# Patient Record
Sex: Female | Born: 1970 | Race: Black or African American | Hispanic: No | Marital: Single | Smoking: Current every day smoker
Health system: Southern US, Community
[De-identification: ages and names within clinical notes are randomized; demographics above are authoritative.]

## PROBLEM LIST (undated history)

## (undated) DIAGNOSIS — K219 Gastro-esophageal reflux disease without esophagitis: Secondary | ICD-10-CM

## (undated) DIAGNOSIS — G8929 Other chronic pain: Secondary | ICD-10-CM

## (undated) DIAGNOSIS — M549 Dorsalgia, unspecified: Secondary | ICD-10-CM

## (undated) DIAGNOSIS — F32A Depression, unspecified: Secondary | ICD-10-CM

## (undated) DIAGNOSIS — O009 Unspecified ectopic pregnancy without intrauterine pregnancy: Secondary | ICD-10-CM

## (undated) DIAGNOSIS — M51369 Other intervertebral disc degeneration, lumbar region without mention of lumbar back pain or lower extremity pain: Secondary | ICD-10-CM

## (undated) DIAGNOSIS — F319 Bipolar disorder, unspecified: Secondary | ICD-10-CM

## (undated) DIAGNOSIS — Z8669 Personal history of other diseases of the nervous system and sense organs: Secondary | ICD-10-CM

## (undated) DIAGNOSIS — F329 Major depressive disorder, single episode, unspecified: Secondary | ICD-10-CM

## (undated) DIAGNOSIS — M5136 Other intervertebral disc degeneration, lumbar region: Secondary | ICD-10-CM

## (undated) DIAGNOSIS — I2089 Other forms of angina pectoris: Secondary | ICD-10-CM

## (undated) DIAGNOSIS — G935 Compression of brain: Secondary | ICD-10-CM

## (undated) DIAGNOSIS — M5030 Other cervical disc degeneration, unspecified cervical region: Secondary | ICD-10-CM

## (undated) DIAGNOSIS — G039 Meningitis, unspecified: Secondary | ICD-10-CM

## (undated) DIAGNOSIS — K5792 Diverticulitis of intestine, part unspecified, without perforation or abscess without bleeding: Secondary | ICD-10-CM

## (undated) DIAGNOSIS — I208 Other forms of angina pectoris: Secondary | ICD-10-CM

## (undated) HISTORY — DX: Diverticulitis of intestine, part unspecified, without perforation or abscess without bleeding: K57.92

## (undated) HISTORY — PX: CHOLECYSTECTOMY: SHX55

## (undated) HISTORY — DX: Compression of brain: G93.5

## (undated) HISTORY — PX: VENTRICULOPERITONEAL SHUNT: SHX204

## (undated) HISTORY — DX: Other forms of angina pectoris: I20.89

## (undated) HISTORY — DX: Other intervertebral disc degeneration, lumbar region without mention of lumbar back pain or lower extremity pain: M51.369

## (undated) HISTORY — DX: Other forms of angina pectoris: I20.8

## (undated) HISTORY — DX: Meningitis, unspecified: G03.9

## (undated) HISTORY — DX: Gastro-esophageal reflux disease without esophagitis: K21.9

## (undated) HISTORY — DX: Other cervical disc degeneration, unspecified cervical region: M50.30

## (undated) HISTORY — DX: Other intervertebral disc degeneration, lumbar region: M51.36

## (undated) HISTORY — DX: Personal history of other diseases of the nervous system and sense organs: Z86.69

## (undated) HISTORY — PX: OTHER SURGICAL HISTORY: SHX169

## (undated) HISTORY — PX: ABDOMINAL HYSTERECTOMY: SHX81

## (undated) HISTORY — PX: BACK SURGERY: SHX140

## (undated) HISTORY — PX: BRAIN SURGERY: SHX531

---

## 1997-12-26 ENCOUNTER — Emergency Department (HOSPITAL_COMMUNITY): Admission: EM | Admit: 1997-12-26 | Discharge: 1997-12-26 | Payer: Self-pay | Admitting: Internal Medicine

## 1997-12-29 ENCOUNTER — Inpatient Hospital Stay (HOSPITAL_COMMUNITY): Admission: AD | Admit: 1997-12-29 | Discharge: 1997-12-31 | Payer: Self-pay | Admitting: Obstetrics & Gynecology

## 1998-01-14 ENCOUNTER — Inpatient Hospital Stay (HOSPITAL_COMMUNITY): Admission: AD | Admit: 1998-01-14 | Discharge: 1998-01-17 | Payer: Self-pay | Admitting: *Deleted

## 1998-03-01 ENCOUNTER — Emergency Department (HOSPITAL_COMMUNITY): Admission: EM | Admit: 1998-03-01 | Discharge: 1998-03-01 | Payer: Self-pay | Admitting: Emergency Medicine

## 1999-11-11 ENCOUNTER — Inpatient Hospital Stay (HOSPITAL_COMMUNITY): Admission: AD | Admit: 1999-11-11 | Discharge: 1999-11-11 | Payer: Self-pay | Admitting: Obstetrics

## 2000-01-16 ENCOUNTER — Inpatient Hospital Stay (HOSPITAL_COMMUNITY): Admission: EM | Admit: 2000-01-16 | Discharge: 2000-01-16 | Payer: Self-pay | Admitting: Obstetrics

## 2000-01-18 ENCOUNTER — Inpatient Hospital Stay (HOSPITAL_COMMUNITY): Admission: AD | Admit: 2000-01-18 | Discharge: 2000-01-18 | Payer: Self-pay | Admitting: *Deleted

## 2000-04-17 ENCOUNTER — Inpatient Hospital Stay (HOSPITAL_COMMUNITY): Admission: AD | Admit: 2000-04-17 | Discharge: 2000-04-17 | Payer: Self-pay | Admitting: Obstetrics

## 2000-10-20 ENCOUNTER — Emergency Department (HOSPITAL_COMMUNITY): Admission: EM | Admit: 2000-10-20 | Discharge: 2000-10-20 | Payer: Self-pay

## 2001-01-07 ENCOUNTER — Emergency Department (HOSPITAL_COMMUNITY): Admission: EM | Admit: 2001-01-07 | Discharge: 2001-01-07 | Payer: Self-pay | Admitting: *Deleted

## 2001-05-23 ENCOUNTER — Inpatient Hospital Stay (HOSPITAL_COMMUNITY): Admission: AD | Admit: 2001-05-23 | Discharge: 2001-05-23 | Payer: Self-pay | Admitting: Obstetrics & Gynecology

## 2001-05-28 ENCOUNTER — Encounter: Payer: Self-pay | Admitting: Family Medicine

## 2001-05-28 ENCOUNTER — Encounter: Admission: RE | Admit: 2001-05-28 | Discharge: 2001-05-28 | Payer: Self-pay | Admitting: Family Medicine

## 2001-06-27 ENCOUNTER — Encounter: Payer: Self-pay | Admitting: *Deleted

## 2001-06-27 ENCOUNTER — Observation Stay (HOSPITAL_COMMUNITY): Admission: AD | Admit: 2001-06-27 | Discharge: 2001-06-28 | Payer: Self-pay | Admitting: *Deleted

## 2001-06-27 ENCOUNTER — Encounter (INDEPENDENT_AMBULATORY_CARE_PROVIDER_SITE_OTHER): Payer: Self-pay

## 2001-06-30 ENCOUNTER — Inpatient Hospital Stay (HOSPITAL_COMMUNITY): Admission: AD | Admit: 2001-06-30 | Discharge: 2001-06-30 | Payer: Self-pay | Admitting: *Deleted

## 2001-07-01 ENCOUNTER — Inpatient Hospital Stay (HOSPITAL_COMMUNITY): Admission: AD | Admit: 2001-07-01 | Discharge: 2001-07-01 | Payer: Self-pay | Admitting: Obstetrics

## 2001-09-15 ENCOUNTER — Emergency Department (HOSPITAL_COMMUNITY): Admission: EM | Admit: 2001-09-15 | Discharge: 2001-09-15 | Payer: Self-pay | Admitting: Internal Medicine

## 2001-09-15 ENCOUNTER — Encounter: Payer: Self-pay | Admitting: Emergency Medicine

## 2001-09-18 ENCOUNTER — Encounter: Payer: Self-pay | Admitting: Emergency Medicine

## 2001-09-18 ENCOUNTER — Ambulatory Visit (HOSPITAL_COMMUNITY): Admission: RE | Admit: 2001-09-18 | Discharge: 2001-09-18 | Payer: Self-pay | Admitting: Emergency Medicine

## 2001-09-28 ENCOUNTER — Encounter: Admission: RE | Admit: 2001-09-28 | Discharge: 2001-12-27 | Payer: Self-pay | Admitting: Orthopedic Surgery

## 2001-11-03 ENCOUNTER — Encounter: Payer: Self-pay | Admitting: Family Medicine

## 2001-11-03 ENCOUNTER — Ambulatory Visit (HOSPITAL_COMMUNITY): Admission: RE | Admit: 2001-11-03 | Discharge: 2001-11-03 | Payer: Self-pay | Admitting: Family Medicine

## 2001-12-28 ENCOUNTER — Encounter: Payer: Self-pay | Admitting: Neurosurgery

## 2001-12-28 ENCOUNTER — Encounter: Admission: RE | Admit: 2001-12-28 | Discharge: 2001-12-28 | Payer: Self-pay | Admitting: Neurosurgery

## 2002-01-13 ENCOUNTER — Ambulatory Visit (HOSPITAL_COMMUNITY): Admission: RE | Admit: 2002-01-13 | Discharge: 2002-01-14 | Payer: Self-pay | Admitting: Neurosurgery

## 2002-01-13 ENCOUNTER — Encounter: Payer: Self-pay | Admitting: Neurosurgery

## 2002-02-26 ENCOUNTER — Encounter: Payer: Self-pay | Admitting: Neurosurgery

## 2002-02-26 ENCOUNTER — Ambulatory Visit (HOSPITAL_COMMUNITY): Admission: RE | Admit: 2002-02-26 | Discharge: 2002-02-26 | Payer: Self-pay | Admitting: Neurosurgery

## 2002-03-19 ENCOUNTER — Encounter: Payer: Self-pay | Admitting: Emergency Medicine

## 2002-03-19 ENCOUNTER — Emergency Department (HOSPITAL_COMMUNITY): Admission: EM | Admit: 2002-03-19 | Discharge: 2002-03-19 | Payer: Self-pay | Admitting: Emergency Medicine

## 2002-04-14 ENCOUNTER — Inpatient Hospital Stay (HOSPITAL_COMMUNITY): Admission: RE | Admit: 2002-04-14 | Discharge: 2002-04-19 | Payer: Self-pay | Admitting: Neurosurgery

## 2002-05-08 ENCOUNTER — Encounter: Payer: Self-pay | Admitting: Emergency Medicine

## 2002-05-08 ENCOUNTER — Emergency Department (HOSPITAL_COMMUNITY): Admission: EM | Admit: 2002-05-08 | Discharge: 2002-05-08 | Payer: Self-pay | Admitting: Emergency Medicine

## 2002-05-08 ENCOUNTER — Encounter: Payer: Self-pay | Admitting: Neurosurgery

## 2002-05-11 ENCOUNTER — Inpatient Hospital Stay (HOSPITAL_COMMUNITY): Admission: AD | Admit: 2002-05-11 | Discharge: 2002-05-21 | Payer: Self-pay | Admitting: Neurosurgery

## 2002-05-19 ENCOUNTER — Encounter: Payer: Self-pay | Admitting: Neurosurgery

## 2002-05-20 ENCOUNTER — Encounter: Payer: Self-pay | Admitting: Neurosurgery

## 2002-06-02 ENCOUNTER — Emergency Department (HOSPITAL_COMMUNITY): Admission: EM | Admit: 2002-06-02 | Discharge: 2002-06-02 | Payer: Self-pay | Admitting: Emergency Medicine

## 2002-06-02 ENCOUNTER — Encounter: Payer: Self-pay | Admitting: Emergency Medicine

## 2002-06-05 ENCOUNTER — Inpatient Hospital Stay (HOSPITAL_COMMUNITY): Admission: EM | Admit: 2002-06-05 | Discharge: 2002-06-07 | Payer: Self-pay | Admitting: Emergency Medicine

## 2002-06-05 ENCOUNTER — Encounter: Payer: Self-pay | Admitting: Emergency Medicine

## 2002-06-06 ENCOUNTER — Encounter: Payer: Self-pay | Admitting: Surgery

## 2002-06-07 ENCOUNTER — Encounter (INDEPENDENT_AMBULATORY_CARE_PROVIDER_SITE_OTHER): Payer: Self-pay

## 2002-06-23 ENCOUNTER — Ambulatory Visit (HOSPITAL_COMMUNITY): Admission: RE | Admit: 2002-06-23 | Discharge: 2002-06-23 | Payer: Self-pay | Admitting: Neurosurgery

## 2002-06-23 ENCOUNTER — Encounter: Payer: Self-pay | Admitting: Neurosurgery

## 2002-07-12 ENCOUNTER — Inpatient Hospital Stay (HOSPITAL_COMMUNITY): Admission: RE | Admit: 2002-07-12 | Discharge: 2002-07-14 | Payer: Self-pay | Admitting: Neurosurgery

## 2002-07-16 ENCOUNTER — Encounter: Payer: Self-pay | Admitting: Emergency Medicine

## 2002-07-16 ENCOUNTER — Emergency Department (HOSPITAL_COMMUNITY): Admission: EM | Admit: 2002-07-16 | Discharge: 2002-07-17 | Payer: Self-pay | Admitting: Emergency Medicine

## 2002-08-17 ENCOUNTER — Encounter: Admission: RE | Admit: 2002-08-17 | Discharge: 2002-08-17 | Payer: Self-pay | Admitting: Neurosurgery

## 2002-08-17 ENCOUNTER — Encounter: Payer: Self-pay | Admitting: Neurosurgery

## 2002-09-08 ENCOUNTER — Inpatient Hospital Stay (HOSPITAL_COMMUNITY): Admission: RE | Admit: 2002-09-08 | Discharge: 2002-09-10 | Payer: Self-pay | Admitting: Neurosurgery

## 2002-10-14 ENCOUNTER — Encounter: Admission: RE | Admit: 2002-10-14 | Discharge: 2003-01-12 | Payer: Self-pay | Admitting: Neurosurgery

## 2002-10-26 ENCOUNTER — Emergency Department (HOSPITAL_COMMUNITY): Admission: EM | Admit: 2002-10-26 | Discharge: 2002-10-27 | Payer: Self-pay | Admitting: Emergency Medicine

## 2002-10-27 ENCOUNTER — Encounter: Payer: Self-pay | Admitting: Emergency Medicine

## 2002-12-09 ENCOUNTER — Ambulatory Visit (HOSPITAL_COMMUNITY): Admission: RE | Admit: 2002-12-09 | Discharge: 2002-12-09 | Payer: Self-pay | Admitting: Neurosurgery

## 2003-02-01 ENCOUNTER — Encounter: Payer: Self-pay | Admitting: Neurosurgery

## 2003-02-01 ENCOUNTER — Ambulatory Visit (HOSPITAL_COMMUNITY): Admission: RE | Admit: 2003-02-01 | Discharge: 2003-02-01 | Payer: Self-pay | Admitting: Neurosurgery

## 2003-02-10 ENCOUNTER — Encounter: Admission: RE | Admit: 2003-02-10 | Discharge: 2003-04-22 | Payer: Self-pay | Admitting: Neurosurgery

## 2003-02-16 ENCOUNTER — Inpatient Hospital Stay (HOSPITAL_COMMUNITY): Admission: AD | Admit: 2003-02-16 | Discharge: 2003-02-16 | Payer: Self-pay | Admitting: Obstetrics and Gynecology

## 2003-05-18 ENCOUNTER — Ambulatory Visit (HOSPITAL_COMMUNITY): Admission: RE | Admit: 2003-05-18 | Discharge: 2003-05-18 | Payer: Self-pay | Admitting: Family Medicine

## 2003-07-13 ENCOUNTER — Emergency Department (HOSPITAL_COMMUNITY): Admission: EM | Admit: 2003-07-13 | Discharge: 2003-07-13 | Payer: Self-pay | Admitting: Emergency Medicine

## 2003-09-07 ENCOUNTER — Encounter
Admission: RE | Admit: 2003-09-07 | Discharge: 2003-12-06 | Payer: Self-pay | Admitting: Physical Medicine & Rehabilitation

## 2003-11-17 ENCOUNTER — Encounter: Admission: RE | Admit: 2003-11-17 | Discharge: 2003-11-17 | Payer: Self-pay | Admitting: Obstetrics and Gynecology

## 2003-12-02 ENCOUNTER — Emergency Department (HOSPITAL_COMMUNITY): Admission: EM | Admit: 2003-12-02 | Discharge: 2003-12-02 | Payer: Self-pay | Admitting: Emergency Medicine

## 2003-12-20 ENCOUNTER — Encounter
Admission: RE | Admit: 2003-12-20 | Discharge: 2004-02-16 | Payer: Self-pay | Admitting: Physical Medicine & Rehabilitation

## 2004-02-16 ENCOUNTER — Encounter
Admission: RE | Admit: 2004-02-16 | Discharge: 2004-05-16 | Payer: Self-pay | Admitting: Physical Medicine & Rehabilitation

## 2004-02-20 ENCOUNTER — Ambulatory Visit: Payer: Self-pay | Admitting: Physical Medicine & Rehabilitation

## 2004-05-02 ENCOUNTER — Ambulatory Visit: Payer: Self-pay | Admitting: Family Medicine

## 2004-05-10 ENCOUNTER — Emergency Department (HOSPITAL_COMMUNITY): Admission: EM | Admit: 2004-05-10 | Discharge: 2004-05-10 | Payer: Self-pay | Admitting: Emergency Medicine

## 2004-05-17 ENCOUNTER — Encounter
Admission: RE | Admit: 2004-05-17 | Discharge: 2004-08-13 | Payer: Self-pay | Admitting: Physical Medicine & Rehabilitation

## 2004-07-04 ENCOUNTER — Ambulatory Visit: Payer: Self-pay | Admitting: Family Medicine

## 2004-08-13 ENCOUNTER — Encounter
Admission: RE | Admit: 2004-08-13 | Discharge: 2004-11-09 | Payer: Self-pay | Admitting: Physical Medicine & Rehabilitation

## 2004-08-15 ENCOUNTER — Ambulatory Visit: Payer: Self-pay | Admitting: Physical Medicine & Rehabilitation

## 2004-09-06 ENCOUNTER — Ambulatory Visit: Payer: Self-pay | Admitting: Family Medicine

## 2004-11-09 ENCOUNTER — Encounter
Admission: RE | Admit: 2004-11-09 | Discharge: 2005-02-07 | Payer: Self-pay | Admitting: Physical Medicine & Rehabilitation

## 2004-11-12 ENCOUNTER — Ambulatory Visit: Payer: Self-pay | Admitting: Physical Medicine & Rehabilitation

## 2005-01-22 ENCOUNTER — Ambulatory Visit: Payer: Self-pay | Admitting: Physical Medicine & Rehabilitation

## 2005-03-20 ENCOUNTER — Encounter
Admission: RE | Admit: 2005-03-20 | Discharge: 2005-06-18 | Payer: Self-pay | Admitting: Physical Medicine & Rehabilitation

## 2005-03-20 ENCOUNTER — Ambulatory Visit: Payer: Self-pay | Admitting: Physical Medicine & Rehabilitation

## 2005-07-02 ENCOUNTER — Ambulatory Visit: Payer: Self-pay | Admitting: Physical Medicine & Rehabilitation

## 2005-07-02 ENCOUNTER — Encounter
Admission: RE | Admit: 2005-07-02 | Discharge: 2005-09-30 | Payer: Self-pay | Admitting: Physical Medicine & Rehabilitation

## 2005-09-27 ENCOUNTER — Ambulatory Visit: Payer: Self-pay | Admitting: Physical Medicine & Rehabilitation

## 2005-09-27 ENCOUNTER — Encounter
Admission: RE | Admit: 2005-09-27 | Discharge: 2005-12-26 | Payer: Self-pay | Admitting: Physical Medicine & Rehabilitation

## 2005-12-27 ENCOUNTER — Ambulatory Visit: Payer: Self-pay | Admitting: Physical Medicine & Rehabilitation

## 2005-12-27 ENCOUNTER — Encounter
Admission: RE | Admit: 2005-12-27 | Discharge: 2006-03-27 | Payer: Self-pay | Admitting: Physical Medicine & Rehabilitation

## 2006-03-26 ENCOUNTER — Ambulatory Visit: Payer: Self-pay | Admitting: Physical Medicine & Rehabilitation

## 2006-03-26 ENCOUNTER — Encounter
Admission: RE | Admit: 2006-03-26 | Discharge: 2006-06-24 | Payer: Self-pay | Admitting: Physical Medicine & Rehabilitation

## 2006-07-23 ENCOUNTER — Encounter
Admission: RE | Admit: 2006-07-23 | Discharge: 2006-10-21 | Payer: Self-pay | Admitting: Physical Medicine & Rehabilitation

## 2006-07-23 ENCOUNTER — Ambulatory Visit: Payer: Self-pay | Admitting: Physical Medicine & Rehabilitation

## 2006-11-06 ENCOUNTER — Encounter
Admission: RE | Admit: 2006-11-06 | Discharge: 2007-02-04 | Payer: Self-pay | Admitting: Physical Medicine & Rehabilitation

## 2006-11-06 ENCOUNTER — Ambulatory Visit: Payer: Self-pay | Admitting: Physical Medicine & Rehabilitation

## 2007-01-30 ENCOUNTER — Ambulatory Visit: Payer: Self-pay | Admitting: Physical Medicine & Rehabilitation

## 2007-04-24 ENCOUNTER — Ambulatory Visit: Payer: Self-pay | Admitting: Physical Medicine & Rehabilitation

## 2007-04-24 ENCOUNTER — Encounter
Admission: RE | Admit: 2007-04-24 | Discharge: 2007-04-27 | Payer: Self-pay | Admitting: Physical Medicine & Rehabilitation

## 2007-07-21 ENCOUNTER — Ambulatory Visit: Payer: Self-pay | Admitting: Physical Medicine & Rehabilitation

## 2007-07-22 ENCOUNTER — Encounter
Admission: RE | Admit: 2007-07-22 | Discharge: 2007-07-22 | Payer: Self-pay | Admitting: Physical Medicine & Rehabilitation

## 2007-10-15 ENCOUNTER — Encounter
Admission: RE | Admit: 2007-10-15 | Discharge: 2007-10-19 | Payer: Self-pay | Admitting: Physical Medicine & Rehabilitation

## 2007-10-19 ENCOUNTER — Ambulatory Visit: Payer: Self-pay | Admitting: Physical Medicine & Rehabilitation

## 2008-01-11 ENCOUNTER — Encounter
Admission: RE | Admit: 2008-01-11 | Discharge: 2008-01-11 | Payer: Self-pay | Admitting: Physical Medicine & Rehabilitation

## 2008-01-11 ENCOUNTER — Ambulatory Visit: Payer: Self-pay | Admitting: Physical Medicine & Rehabilitation

## 2008-04-11 ENCOUNTER — Ambulatory Visit: Payer: Self-pay | Admitting: Physical Medicine & Rehabilitation

## 2008-04-11 ENCOUNTER — Encounter
Admission: RE | Admit: 2008-04-11 | Discharge: 2008-04-11 | Payer: Self-pay | Admitting: Physical Medicine & Rehabilitation

## 2008-07-08 ENCOUNTER — Encounter
Admission: RE | Admit: 2008-07-08 | Discharge: 2008-10-06 | Payer: Self-pay | Admitting: Physical Medicine & Rehabilitation

## 2008-07-13 ENCOUNTER — Ambulatory Visit: Payer: Self-pay | Admitting: Physical Medicine & Rehabilitation

## 2008-09-07 ENCOUNTER — Ambulatory Visit: Payer: Self-pay | Admitting: Diagnostic Radiology

## 2008-09-07 ENCOUNTER — Emergency Department (HOSPITAL_BASED_OUTPATIENT_CLINIC_OR_DEPARTMENT_OTHER): Admission: EM | Admit: 2008-09-07 | Discharge: 2008-09-07 | Payer: Self-pay | Admitting: Emergency Medicine

## 2009-01-12 ENCOUNTER — Emergency Department (HOSPITAL_BASED_OUTPATIENT_CLINIC_OR_DEPARTMENT_OTHER): Admission: EM | Admit: 2009-01-12 | Discharge: 2009-01-12 | Payer: Self-pay | Admitting: Emergency Medicine

## 2009-01-12 ENCOUNTER — Ambulatory Visit: Payer: Self-pay | Admitting: Radiology

## 2009-02-28 ENCOUNTER — Emergency Department (HOSPITAL_BASED_OUTPATIENT_CLINIC_OR_DEPARTMENT_OTHER): Admission: EM | Admit: 2009-02-28 | Discharge: 2009-02-28 | Payer: Self-pay | Admitting: Emergency Medicine

## 2010-10-23 NOTE — Assessment & Plan Note (Signed)
Ms. Beth Kennedy returns to the clinic today for a follow-up evaluation.  She  continues to have problems with her runaway daughter, who should be on  psychiatric medications.  She reports to me that the 39 year old runs  away and then does not take her antipsychotic medication.   The patient herself reports that she has been using her Flexeril,  hydrocodone, and Neurontin.  She reports that she has started using  aspirin, as many as three per day, as that seems to give her some relief  from her headaches.  I have warned her against over-use of the aspirin  as a potential cause of ulcers.   MEDICATIONS:  1. Flexeril 10 mg t.i.d. p.r.n.  2. Hydrocodone 5/325 1 tablet 5 times per day p.r.n.  3. Neurontin 300 mg 1/2 tablet t.i.d.  4. Depakote daily.  5. Aspirin 325 mg 1 tablet t.i.d.   REVIEW OF SYSTEMS:  Positive for weight loss and poor appetite.   PHYSICAL EXAMINATION:  A middle-aged adult female in moderate acute  discomfort.  Blood pressure 120/57 with a pulse of 82, respiratory rate 18, O2  saturation 98% on room air.  She ambulates without any assistive device.  She has at least +4 to -5/5  strength throughout.   IMPRESSION:  1. History of left-sided arm and leg weakness after prior      ventriculoperitoneal shunt.  2. Recent left fourth metatarsal fracture, treated conservatively.   In the office today, we did discuss the over-use of the aspirin  medication and the fact that she will likely develop stomach ulcers if  she continues to use the amount that she is using.  Instead, I have  asked her to cut down to no more than one tablet per day, then she can  increase her hydrocodone up to a maximum of six tablets per day.  We  also refilled her Neurontin and Flexeril in the office today.  We will  plan on seeing her in followup in approximately 3-4 months' time with  refills prior to that appointment as necessary.           ______________________________  Ellwood Dense,  M.D.     DC/MedQ  D:  10/19/2007 13:55:33  T:  10/19/2007 14:06:29  Job #:  [id number]

## 2010-10-23 NOTE — Assessment & Plan Note (Signed)
Ms. Texidor returns to clinic today for followup evaluation.  She  reports that she is having some increased pain with colder damper  weather recently.  She does need a refill on her hydrocodone and  ibuprofen in the office today.  Those two medications along with her  Flexeril are what she is using for pain, and she gets reasonable relief  overall.   MEDICATIONS:  1. Flexeril 10 mg one tablet t.i.d.  2. Ibuprofen 600 mg 1/2 tablet p.o. b.i.d.  3. Hydrocodone 5/325 one tablet q.i.d. p.r.n. (3-4 per day).   REVIEW OF SYSTEMS:  Noncontributory.   PHYSICAL EXAMINATION:  GENERAL:  A well-appearing middle-aged adult  female in mild acute discomfort.  VITAL SIGNS:  Blood pressure 113/64, pulse 81, respiratory rate 16, O2  saturation 98% on room air.  EXTREMITIES:  She ambulates without any assistive device with slight  weakness of her left leg.   IMPRESSION:  1. History of left-sided arm and leg weakness after prior      ventriculoperitoneal shunt.  2. Recent left fourth metatarsal fracture treated conservatively.  3. In the office today we did refill the patient's ibuprofen along      with her hydrocodone.  Will plan on seeing the patient in followup      in this office in approximately three months' time with refills      prior to that appointment if necessary.  She continues to get good      analgesic effect without significant side effects or signs of      diversion.           ______________________________  Ellwood Dense, M.D.     DC/MedQ  D:  04/27/2007 09:55:36  T:  04/27/2007 13:42:55  Job #:  [id number]

## 2010-10-23 NOTE — Assessment & Plan Note (Signed)
REASON FOR VISIT:  Ms. Loveall returns to clinic today for followup  evaluation.  She reports that she has stopped the chiropractor treatment  completely.  She reports that she was doing well until they started  doing adjustments of her neck and then she had increased pain.  She also  reports that she is out of her psychiatric medicines, but plans to  follow up with [facility] on February 11, 2007.  She is  taking hydrocodone approximately 3-4 times per day and getting a fair  amount of relief along with Flexeril and ibuprofen.  She does need  refills on each of those last three medicines.   MEDICATIONS:  1. Flexeril 10 mg one tablet t.i.d.  2. Ibuprofen 600 mg one-half tablet b.i.d.  3. Hydrocodone 5/325 one tablet three to four times per day p.r.n.   REVIEW OF SYSTEMS:  Positive for fever and chills along with poor  appetite.   PHYSICAL EXAMINATION:  GENERAL:  Well-appearing, middle-aged, adult  female in mild acute discomfort.  VITAL SIGNS:  Blood pressure 117/73, pulse 85, respirations 18, O2  saturations 98% on room air.  NEUROLOGIC:  She has 4/5 strength throughout the bilateral upper and  lower extremities.  She ambulates without any assistive device.   IMPRESSION:  History of left-sided arm and leg weakness after prior  ventriculoperitoneal shunt.  Recent left fourth metatarsal fracture,  treated conservatively.   PLAN:  In the office today, we did refill the patient's Flexeril along  with her ibuprofen and hydrocodone.  Will plan on seeing her in followup  in approximately 3 months' time with refills prior to that appointment  as necessary.           ______________________________  Ellwood Dense, M.D.     DC/MedQ  D:  02/02/2007 10:15:50  T:  02/02/2007 12:41:48  Job #:  [id number]

## 2010-10-23 NOTE — Assessment & Plan Note (Signed)
Beth Kennedy returns to clinic today for follow up evaluation.  She  reports that she fell yesterday on her sisters patio when she tripped  over a nail that was sticking up.  She reportedly hit her head and plans  to have an MRI scan today to evaluate the prior ventriculoperitoneal  shunt.  She reports that she is getting reasonable relief from the  hydrocodone, ibuprofen and Flexeril.  She does note some sleepiness with  the Flexeril, especially when she takes it in the morning.  She does  need refills on each of her medicines in the office today.   MEDICATIONS:  1. Trazodone 100 mg p.o. at bedtime.  2. Flexeril 10 mg twice daily.  3. Ibuprofen 600 mg 1/2 tablet twice daily.  4. Hydrocodone 5/325 one tablet three to four times per day p.r.n.  5. Concerta daily.  6. Risperdal daily.  7. Lamictal daily.   REVIEW OF SYSTEMS:  Positive for fever and chills and night sweats.   PHYSICAL EXAMINATION:  GENERAL:  Well-appearing middle-aged adult female  in mild acute discomfort.  VITAL SIGNS:  Blood pressure 122/60 with pulse of 85, respiratory rate  17 and O2 saturation 98% on room air.  NEUROLOGIC:  She has 4+/5 strength throughout the bilateral upper and  lower extremities.  She ambulates without any assistive device.   IMPRESSION:  1. History of left-sided arm and leg weakness with prior      ventriculoperitoneal shunt.  2. Recent left fourth metatarsal fracture, treated conservatively.   In the office today, we did refill the patient's hydrocodone along with  her ibuprofen and Flexeril medications.  She can decrease the Flexeril  down to 1/2 tablet, especially in the morning, to see if that relieves  some of her sleepiness.  The nighttime dose will probably stay at 10 mg  to help her with sleep.  We will plan on seeing her in follow up in  approximately 3 months' time with refills prior to that appointment as  necessary.           ______________________________  Ellwood Dense,  M.D.     DC/MedQ  D:  11/07/2006 09:27:14  T:  11/07/2006 09:45:44  Job #:  [id number]

## 2010-10-23 NOTE — Assessment & Plan Note (Signed)
Mr. Wagenaar returns to the clinic today for followup evaluation.  Overall, she is doing well.  During the last clinic visit on January 11, 2008, we had increased her hydrocodone up to a 7.5/325 strength and  allowed her to still use 6 tablets per day.  She reports that that is  helping substantially.  She does need a refill on her Neurontin in the  office today, but has sufficient supply of hydrocodone and Flexeril.   The patient reports that she has undergone partial hysterectomy for  ovarian cyst and is still healing from that surgical wound.   MEDICATIONS:  1. Flexeril 10 mg 1 tablet t.i.d. p.r.n.  2. Hydrocodone 7.5/325 one tablet 6 times per day p.r.n.  3. Neurontin 300 mg 1 tablet b.i.d.  4. Depakote daily.  5. Aspirin 325 mg 2 tablets daily.   REVIEW OF SYSTEMS:  Noncontributory.   PHYSICAL EXAMINATION:  GENERAL:  Well-appearing, middle-aged, adult  female in mild-to-no acute discomfort.  VITAL SIGNS:  Blood pressure 117/65 with a pulse of 84, respiratory rate  18, and O2 saturation on 99% on room air.  EXTREMITIES:  She has 4+/5 strength throughout the bilateral upper and  lower extremities.  She ambulates without any assistive device.   IMPRESSION:  1. History of left-sided arm and leg weakness after prior      ventriculoperitoneal shunt.  2. Recent left fourth metatarsal fracture, treated conservatively.   In the office today, we did refill the patient's Neurontin at 300 mg  twice-a-day dosing.  We will plan on seeing the patient in followup in  this office in approximately 3-4 months' time with refills prior to that  appointment as necessary.           ______________________________  Ellwood Dense, M.D.     DC/MedQ  D:  04/13/2008 09:14:37  T:  04/13/2008 23:37:45  Job #:  [id number]

## 2010-10-23 NOTE — Assessment & Plan Note (Signed)
Beth Kennedy returns to clinic today for followup evaluation.  She has  been using hydrocodone 5/325 approximately 5-6 times per day.  She  reports that she now is getting only relief for approximately 1-1/12  hours with each dose.  She has been on the same dose for few years.  She  does need a refill on the Flexeril and adjustment in hydrocodone.  She  reports that she is having hysterectomy over the next couple weeks for  uterine fibroids.  She continues to use aspirin approximately 2 tablets  per day, but reports that she is trying to come off that completely as  she feels that it may be causing systemic irritation.   MEDICATIONS:  1. Flexeril 10 mg 1 tablet t.i.d. p.r.n.  2. Hydrocodone 5/325 one tablet 6 times per day p.r.n.  3. Neurontin 300 mg one-half tablet t.i.d.  4. Depakote daily.  5. Aspirin 325 mg 2 tablets daily.   REVIEW OF SYSTEMS:  Positive for abdominal pain, poor appetite.   PHYSICAL EXAMINATION:  GENERAL:  Middle-aged adult female in moderate  acute discomfort.  VITAL SIGNS:  Blood pressure is 126/76 with a pulse of 79, respiratory  rate 18, and O2 saturation 99% on room air.  She has 4+/5 strength  throughout the bilateral upper and lower extremities.  She ambulates  without any assisted device.   IMPRESSION:  1. History of left-sided arm and leg weakness after prior      ventriculoperitoneal shunt.  2. Recent left fourth metatarsal fracture, treated conservatively.   In the office today, we did refill the patient's Flexeril and increased  her hydrocodone up to 7.5/325 dose to be used 6 times per day  p.r.n.  She reports that she is having increased neck pain on the right with  prior scan of her neck having been done at [facility] on August 16, 2003.  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  There was no evidence of canal or foraminal impingement.   There  was a signal change projecting over the ventral cord at C6-7 favored to  reflect motion/artifact.   We will plan on seeing the patient in followup in this office in  approximately 3 months' time after the scan has been completed.           ______________________________  Ellwood Dense, M.D.     DC/MedQ  D:  01/11/2008 13:04:05  T:  01/12/2008 02:50:52  Job #:  [id number]

## 2010-10-23 NOTE — Assessment & Plan Note (Signed)
Beth Kennedy returns to clinic today reporting that she is having more  pain, possibly related to the colder weather.  She reports that mental  health in [facility] has recently started her on Depakote daily.  The  patient would like to restart the Neurontin as she feels she has  increased pain in her lower extremities that was helped by Neurontin in  the past.  We stopped that approximately July 2007 as she did not feel  she was getting relief.  The patient does report that her leg gave out  on her recently, and she went to the emergency room with x-rays being  reportedly negative for fracture.  The patient would like to increase  her hydrocodone up to approximately 5 times per day from 3 or 4 that she  had been prescribed.  She recently has increased it on her own at this  point.   MEDICATIONS:  1. Flexeril 10 mg t.i.d.  2. Ibuprofen 600 mg 1/2 tablet b.i.d.  3. Hydrocodone 5/325 one tablet 3 to 4 times per day prescribed, but      using 4 to 5 times per day.  4. Depakote daily.   REVIEW OF SYSTEMS:  Positive for night sweats.   PHYSICAL EXAMINATION:  An ill-appearing, middle-aged adult female in  moderate acute discomfort.  Blood pressure was 127/67 with a pulse of 79 and O2 saturation 99% on  room air.  She ambulates without any assistive device with weakness of her left  leg.   IMPRESSION:  1. History of left-sided arm and leg weakness after prior      ventriculoperitoneal shunt.  2. Recent left 4th metatarsal fracture, treated conservatively.   In the office today, we did increase the patient's hydrocodone up to 5  times per day at a 5/325 strength.  We also refilled her Flexeril and  started her back on Neurontin at 300 mg 1/2 tablet p.o. t.i.d.  When she  was discontinued in July of 2007, she had been taking 300 mg 1/2 tablet  5 times per day.  We will increase this dosage as possible over the next  several weeks to months.  We will plan on seeing her in followup in  approximately 3 months' time.           ______________________________  Ellwood Dense, M.D.     DC/MedQ  D:  07/22/2007 10:17:34  T:  07/23/2007 11:46:06  Job #:  [id number]

## 2010-10-23 NOTE — Assessment & Plan Note (Signed)
Ms. Dupriest returns to clinic today for followup evaluation.  She  reports that she is using her hydrocodone approximately 6 tablets per  day.  She has increased her Neurontin up to three times per day from  twice a day.  She has sufficient supply of Neurontin and Flexeril at  this time.  She notes some interaction between the Flexeril and  Neurontin and needs to take them separately despite taking them each  three times per day.  She does need a refill on her hydrocodone in the  office today.  She reports poor sleep, but reports that she has not had  benefit with Tylenol PM used over-the-counter or trazodone used  previously, which caused her to have nightmares.  She has tried Ambien  in the past, but she is not sure what benefit she gained from it.   MEDICATIONS:  1. Flexeril 10 mg one tablet t.i.d. p.r.n.  2. Hydrocodone 7.5/325 one tablet six times per day p.r.n.  3. Neurontin 300 mg one tablet t.i.d.  4. Depakote daily.  5. Aspirin 325 mg two tablets daily.   REVIEW OF SYSTEMS:  Noncontributory.   The patient reports pain interfering with traveling, homemaking,  sleeping, standing, sitting, walking, lifting, and personal care.   PHYSICAL EXAMINATION:  GENERAL:  Well-appearing middle-aged adult female  in mild-to-no acute discomfort.  VITAL SIGNS:  Blood pressure 131/79 with pulse of 76, respiratory rate  18, and O2 saturation 99% on room air.  EXTREMITIES:  She has 4+/5 strength throughout the bilateral upper and  lower extremities.  She ambulates without any assistive device.   IMPRESSION:  1. History of left-sided arm and leg weakness after prior      ventriculoperitoneal shunt.  2. Recent left fourth metatarsal fracture, treated conservatively.   In the office today, we did give the patient a new prescription for  Ambien to be used 10 mg p.o. nightly.  We also refilled her hydrocodone  as of today total of 180, which should cover her for one month's time.  We will plan  on seeing the patient in followup in approximately three  months' time either with myself or with the nursing staff.           ______________________________  Ellwood Dense, M.D.     DC/MedQ  D:  07/13/2008 11:32:43  T:  07/14/2008 01:17:13  Job #:  [id number]

## 2010-10-26 NOTE — Op Note (Signed)
   Beth Kennedy, Beth Kennedy                        ACCOUNT NO.:  [id number]   MEDICAL RECORD NO.:  [id number]                   PATIENT TYPE:  OIB   LOCATION:  2899                                 FACILITY:  [facility]   PHYSICIAN:  Kathaleen Maser. Pool, M.D.                 DATE OF BIRTH:  [birth year]   DATE OF PROCEDURE:  12/09/2002  DATE OF DISCHARGE:                                 OPERATIVE REPORT   PREOPERATIVE DIAGNOSIS:  Possible ventriculoperitoneal shunt infection,  headache.   POSTOPERATIVE DIAGNOSIS:  Possible ventriculoperitoneal shunt infection,  headache.   OPERATION PERFORMED:  Aspiration of right occipital ventriculoperitoneal  shunt.   SURGEON:  Kathaleen Maser. Pool, M.D.   ANESTHESIA:  None.   INDICATIONS FOR PROCEDURE:  Beth Kennedy is a patient who is status post a  right occipital ventriculoperitoneal shunt as well as Chiari malformation  decompression.  The patient has marked headaches, some intermittent low-  grade fevers and overall failure to thrive.  She presents now for shunt  aspiration for work-up for continued symptoms.   DESCRIPTION OF PROCEDURE:  The patient was taken to the minor procedure room  in the short stay center where she was positioned in the left lateral  decubitus position.  Her right occipital scalp was prepped and draped.  A 25  gauge needle was inserted into the proximal reservoir of the shunt system.  Clear CSF was aspirated.  A total of approximately 4mL was removed.  The CSF  flow was slow but steady.  The needle was withdrawn.  Pressure was held over  the wound and there was no evidence of bleeding.  CSF was sent to the lab  for further studies.                                                 Henry A. Pool, M.D.    HAP/MEDQ  D:  12/09/2002  T:  12/09/2002  Job:  [id number]

## 2010-10-26 NOTE — H&P (Signed)
NAME:  Beth, Kennedy                        ACCOUNT NO.:  [id number]   MEDICAL RECORD NO.:  [id number]                   PATIENT TYPE:  INP   LOCATION:  1826                                 FACILITY:  [facility]   PHYSICIAN:  Sandria Bales. Ezzard Standing, M.D.               DATE OF BIRTH:  12/27/1970   DATE OF ADMISSION:  06/05/2002  DATE OF DISCHARGE:                                HISTORY & PHYSICAL   HISTORY OF PRESENT ILLNESS:  This is a 40 year old black female who is a  patient of Dr. Maurice March through [facility].  She gives a  history of having about a one-week period of epigastric abdominal pain and  right upper quadrant pain for which she has not found any relief.  She went  to the [facility] Emergency Room on June 02, 2002.  They drew some  blood on her but not much else.  They talked about doing something with her  gallbladder but did nothing at that time.  She then represented to [facility].  Did an ultrasound which showed a thickened gallbladder  wall with gallstones consistent with acute cholecystitis.  I have been  consulted for evaluation of her gallbladder disease.  She denies any history  of peptic ulcer disease, liver disease, pancreatic disease, or colon  disease.  Her only prior abdominal operations have included a right  fallopian tube removed by Dr. Vincente Poli in January 2003, for benign reasons,  and she has had a tubal ligation.  I think this maybe predated that.  She  has a mother and grandmother who both had gallstone disease.   ALLERGIES:  MORPHINE, which causes nausea.   MEDICATIONS:  Cyclobenzaprine, Roxicet, oxycodone, _______ , meperidine, and  butabarbital.   PAST SURGICAL HISTORY:  Beside her right fallopian surgery was in August  2003, she had a lumbar laminectomy by Dr. Karene Fry, and then on April 14, 2002, she had a craniotomy, C1 laminectomy, and dural patch for a Chiari  malformation.   REVIEW OF SYSTEMS:  NEUROLOGIC:  She  has had complications with her  craniotomy in that she has had a dural leak, required a stitch.  She has  complained of some dizziness and gait instability where she has had to use a  walker.  It seems to be getting better, but she still continues to be  worried about it.  She was supposed to see Dr. Dutch Quint back about two weeks.  PULMONARY:  Does not smoke cigarettes.  No evidence of pneumonia or  tuberculosis.  CARDIAC:  No history of heart disease, chest pain, or  hypertension.  GASTROINTESTINAL:  See history of present illness.  GYNECOLOGIC:  See history of present illness.  She has had a tubal ligation.  Again, she has two children.  Their ages are 36 and 79.  UROLOGIC:  No  history of kidney stones or kidney infections.  SOCIAL HISTORY:  She has not been working for over a year, since December  2002.  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   PHYSICAL EXAMINATION:  VITAL SIGNS:  Temperature 99, blood pressure 116/57,  pulse is 99.  GENERAL:  Well-nourished, middle-aged female.  Alert and cooperative on  physical exam.  HEENT:  She reported _______ to the back of her head, but I saw no CSF leak.  She does have one or two palpable lymph nodes on both sides.  These appear  benign and are probably related to the inflammation around where she had her  surgery.  LUNGS:  Clear to auscultation.  HEART:  Regular rate and rhythm.  Without murmur or rub.  ABDOMEN:  Tender in the epigastric right upper quadrant.  She has no other  mass, tenderness, or organomegaly.  EXTREMITIES:  Good strength in the upper and lower extremities.   LABORATORY DATA:  Laboratories that I have, some are from today, show a  white blood count of 5000, hemoglobin 11, hematocrit 34.  Some were drawn at  [facility] on June 02, 2002, and she had a normal liver function, a  total bilirubin of 0.5, a lipase of 21, amylase of 44, alkaline phosphatase  of 61.   Again, her ultrasound I reviewed.  She  has a thickened gallbladder wall with  stones.  I talked with Dr. ________.  She really has pretty obvious  gallbladder disease.   I discussed with the patient about proceeding with surgery.   IMPRESSION:  1. Acute cholecystitis with cholelithiasis.  Will give her IV antibiotics     and hydrate her tonight and plan surgery first thing in the morning.  I     discussed with her the indications for surgery, potential complications     including but not limited to infection, bleeding, bile leak, and possible     open surgery.  Rosey Bath, the nurse, was in the room while I was discussing     this with her and will plan the surgery tomorrow.  2. 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  I     did discuss the patient with Dr. Wynetta Emery.  He felt there was no     contraindication for general anesthesia.  Thought the balance instability     was actually part of probable recovery from the surgery though she has     had trouble with a dural leak and this kind of thing can usually be     complicated.  3. Lumbar surgery, which she has actually done pretty well with.                                               Sandria Bales. Ezzard Standing, M.D.    DHN/MEDQ  D:  06/05/2002  T:  06/05/2002  Job:  [id number]   cc:   Karene Fry, M.D.   Maurice March, M.D.  [address] [place]  [place] [address]  Fax: [phone]

## 2010-10-26 NOTE — Group Therapy Note (Signed)
REFERRING PHYSICIAN:  Sherilyn Cooter A. Pool, M.D.   PURPOSE OF EVALUATION:  Evaluate and treat chronic pain.   HISTORY OF PRESENT ILLNESS:  Beth Kennedy is a 40 year old, right-handed,  adult female referred to this office through Dr. Lindalou Hose office for chronic  pain management.  I have medical records that accompany the patient and I  will do my best to summarize those.   The patient was initially treated back in 2003 for a left SI radiculopathy,  including conservative management.  That unfortunately failed and she  underwent her first surgery on January 14, 2002, which included a left L5-S1  laminotomy and microdiskectomy performed by Dr. Jordan Likes.   On February 11, 2002, the patient saw Dr. Jordan Likes in followup.  At that time,  her low back pain was improved and she complained of neck pain with right  arm pain.   On February 26, 2002, the patient underwent a cervical spine MRI scan which  showed abnormal tonsillar herniation consistent with type 1 Chiari  malformation with prominence of the central canal opposite C7 with slight  increased caliber of the cervical cord in the mid cervical spine.  A  recommendation for an MRI scan was made.   On March 10, 2002, an MRI scan of the brain showed type 1 Chiari  malformation with an otherwise unremarkable intracranial study.   On April 14, 2002, the patient underwent suboccipital craniectomy and C1  laminectomy with dural patch grafting for decompression and this was done by  Dr. Jordan Likes.   From approximately on May 03, 2002, through May 15, 2002, the  patient was hospitalized and treated for meningitis.  This is according to  the patient.   On May 20, 2002, the patient underwent angiography of the intracranial  circulation, which was negative.   On May 20, 2002, a followup MRI scan of her brain showed 3 x 3 x 5 cm  posterior fossa extra-axial fluid collection related to suboccipital  decompression for Chiari malformation.   On  May 17, 2002, the patient underwent laparoscopic cholecystectomy by  Dr. Ezzard Standing.   On June 13, 2002, the patient underwent a followup MRI scan of her brain  which showed interval enlargement in the postoperative pseudomeningocele  which now measured 4 x 4 x 7 cm.  There was no hydrocephalus noted.   On July 01, 2002, the patient followed up with Dr. Jordan Likes and they  discussed a right ventriculoperitoneal shunt for management of CNS shunting  associated with pseudomeningocele.   On approximately July 12, 2002, the patient underwent a right occipital  ventriculoperitoneal shunt performed by Dr. Jordan Likes.   On August 04, 2002, a followup brain CT showed no ventriculomegaly or  acute intracranial change.  She was status post occipital craniectomy.  There was a fluid collection present measuring 4 x 5.3 cm posteriorly.   On September 08, 2002, the patient again went to surgery and had a revision of  the right occipital ventriculoperitoneal shunt with exploration of the  suboccipital craniectomy with repair of CSF leak and meningocele.   On August 21, 2002, head CT showed minimal meningocele at the surgical site  after interval suboccipital craniotomy.  There was satisfactory appearance  of ventriculostomy catheter with no evidence of hydrocephalus.   On December 06, 2002, the patient saw Dr. Jordan Likes in followup and continued to  complain of headache, malaise, and questionable fevers.   On December 09, 2002, Dr. Jordan Likes did an aspiration of the right occipital  ventriculoperitoneal shunt fluid  with no evidence of infection being shown.   On December 30, 2002, MRI scan of the cervical spine showed status post  occipital craniectomy with no significant finding within the cervical spine.   On December 30, 2002, an MRI scan of the lumbar spine showed mild canal  narrowing at L2-5 secondary to mild degenerative changes and short pedicles.  She was status post L5-S1 laminectomy and there was no herniation   identified.   On April 06, 2003, the patient followed up with Dr. Jordan Likes and her MRI scan  was noted to be unremarkable, according to Dr. Jordan Likes.   On April 06, 2003, a left hip MRI scan showed no significant abnormality  other than a small amount of fluid in the region of the trochanteric bursa,  which may suggest mild trochanteric bursitis.   The patient was subsequently referred to Dr. Orlin Hilding, a local neurologist,  for evaluation of headache pain.   On April 13, 2003, the patient first saw Dr. Orlin Hilding.  At that time, she  had headache along with numerous other neurological complaints without  objective findings on exam, according to Dr. Orlin Hilding.  The only diagnosis  that she made at that time was chronic pain syndrome.  She suggested a trial  of Neurontin and a referral to a chronic pain management clinic.  She also  suggested a possible trial of tizanidine in the future.   On July 15, 2003, the patient followed up with Dr. Orlin Hilding in their  clinic and saw the nurse practitioner.  At that time, she was referred to  Dr. Vear Clock, a local pain management specialist.  Her Neurontin dose was  slightly adjusted.   The patient reports that she did not see Dr. Vear Clock as apparently there  was some problem with insurance.  She does report that she followed up with  Dr. Jordan Likes in approximately mid February of 2005.  She reports that another  MRI scan of her cervical spine and brain were suggested and that she is due  for followup with Dr. Jordan Likes as of September 19, 2003, to find out the results of  those studies.  I did contact [facility] and they faxed the results to  me.  The exam of the cervical spine done on August 16, 2003, without contrast  showed stable post surgical changes related to Chiari I decompression.  There was stable 3-4 mm focus of gradient hypointensity at or within the  right lateral cord at the C5 level.  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  There was no  evidence of canal or foraminal impingement.  There  was a thin linear apparent signal change projecting over the ventral cord at  C6 and C7 favored to reflect motion/truncation artifact.  The MRI scan of  the brain done with and without contrast on the same day showed satisfactory  post suboccipital craniectomy change for Chiari I malformation.  There was  right temporal/occipital approach shunt track identified extending to or  across the third ventricle.  There were no acute findings noted.   At the present time, the patient reports pain through all of her joints,  especially pain in her neck, bilateral shoulders and elbows, hips, knees,  and ankles.  She reports that her left leg is very weak at the present time  and she reports decreased sensation throughout her entire left leg and  including her left upper extremity.  She does report that she has a cane and  a walker, but she  does not use them secondary to pride.  She reports that  she has very poor ability to use her left side and reports that she is  disabled at the present time.  She is followed at [facility] for  her Wellbutrin and at [facility] by Dr. Audria Nine.  She reports that her  Neurontin, Flexeril, and hydrocodone medications have all been prescribed by  Dr. Orlin Hilding or Dr. Jordan Likes.  She reports having started the hydrocodone in  approximately September of 2003.   PAST MEDICAL HISTORY:  1. Bipolar disorder.  2. History of ruptured fallopian tube in January of 2003.  3. Craniectomy and lumbar surgeries as noted above.   ALLERGIES:  MORPHINE.   SOCIAL HISTORY:  The patient lives with two daughters, ages 72 and 45.  She  smokes one-half packs of cigarettes per day.  Denies alcohol intake.  She  denies marijuana use or other street drugs.  She has an 11th grade  education, but has not obtained her GED.  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  She  reports  having applied for SSI and having obtained an attorney for this  purpose.   MEDICATIONS:  1. Neurontin 150 mg five times per day.  2. Flexeril 10 mg b.i.d.  3. Hydrocodone 5/325 mg one tablet q.4h. p.r.n. (three to four per day).  4. Wellbutrin 150 mg b.i.d.   PHYSICAL EXAMINATION:  A ill-appearing adult female who is only minimally  cheerful in the office today.  The blood pressure was 121/52 with a pulse of  91, a respiratory rate of 16, and an O2 saturation of 99% on room air.  The  patient ambulates with extremely stiff left lower extremity kept mostly in  hyperextended position.  She is able to bear weight during ambulation.  She  does not use any assistive device.  She is able to show normal range of  motion throughout her right upper extremity with substantially decreased  range of motion throughout her left upper extremity at the shoulder.  Even  with passive range of motion, she complains of pain in the left shoulder. Passive range of motion and active range of motion were full throughout the  right upper extremity.   The right upper extremity exam showed 5-/5 strength and left upper extremity  strength was at least 4/5 with poor effort.  Reflexes were 2+ throughout her  bilateral upper extremities.  Sensation was intact to light touch throughout  the right side and diffusely decreased to light touch throughout the left  side.  The bilateral lower extremity exam showed hip flexion at least 4-/5  along with knee extension at 4-/5.  There was questionable effort given with  complaints of pain.  Ankle dorsiflexion was at least 4/5 bilaterally.  There  was hyperextension of the bilateral knees in the standing position.  Lumbar  range of motion was decreased in all planes with flexion to only  approximately 30 degrees.   In the supine position, straight leg raise was negative bilaterally, but she  only allowed approximately 30 degrees of motion.  Hip range of motion was  painful  on the left side, but normal on the right side.   IMPRESSION:  1. Diffuse left-sided weakness per report.  2. Status post craniectomy with ventriculoperitoneal shunt placement for     fluid collection.   At the present time, the patient has had extensive studies done by Dr.  Pool  related to her lumbar spine along with her Arnold-Chiari malformation and  subsequent craniectomy and VP shunt.  At this point, Dr. Orlin Hilding in addition  to Dr. Jordan Likes appear to be at a loss to explain her symptoms, although she has  diffuse symptoms, which according to Dr. Orlin Hilding are without objective  findings.  I concur with that in the office today.  We are mostly performing  chronic pain management in this individual.   At the present time, I have given her a refill on her Flexeril at 10 mg one  p.o. b.i.d.  She also reports that she gets good relief with hydrocodone and  uses approximately three to four per day.  I have given her a prescription  for  hydrocodone 5/325 mg to be used one tablet p.o. q.6h. p.r.n.  I have asked  her to keep the use of that medication down to an absolute minimum.  I will  plan on seeing the patient in followup in approximately one month's time.     Ellwood Dense, M.D.   DC/MedQ  D:  09/08/2003 12:27:23  T:  09/08/2003 13:36:55  Job #:  [id number]

## 2010-10-26 NOTE — Group Therapy Note (Signed)
NAME:  Beth Kennedy, Beth Kennedy                        ACCOUNT NO.:  [id number]   MEDICAL RECORD NO.:  [id number]                   PATIENT TYPE:  OUT   LOCATION:  [facility]                           FACILITY:  [facility]   PHYSICIAN:  Argentina Donovan, MD                     DATE OF BIRTH:  04/04/1971   DATE OF SERVICE:  11/17/2003                                    CLINIC NOTE   REASON FOR VISIT:  This is a 40 year old gravida 5, para 2-0-3-2 who has had  multiple operations because of brain malformations and spinal problems who  has had a recurrence of a left Bartholin's cyst and comes in today and we  discussed with her the options and treatment and she decided on a Word  catheter which we inserted.  We also told her that it was best to leave it  in for 3 to 6 weeks and she could cut the end off and remove it herself.  It  would come out.  She is instructed to take sitz baths.  We are going to give  her some medication for pain.   PHYSICAL EXAMINATION:  PELVIC:  She had a 3 cm in diameter apparently  noninfected left Bartholin's cyst which was injected just with 1% Xylocaine  just near the original opening after being cleaned off with some Betadine.  A scalpel stab was placed into the gland just in that area and a clear mucus  discharge came out into which was placed a Word catheter, 5 mL of normal  saline inserted in the Word catheter.  There was minimal bleeding and the  patient was discharged with a Word catheter in place.                                               Argentina Donovan, MD    PR/MEDQ  D:  11/17/2003  T:  11/17/2003  Job:  [id number]

## 2010-10-26 NOTE — Consult Note (Signed)
NAME:  Beth Kennedy, Beth Kennedy                        ACCOUNT NO.:  [id number]   MEDICAL RECORD NO.:  [id number]                   PATIENT TYPE:  EMS   LOCATION:  [facility]                                 FACILITY:  [facility]   PHYSICIAN:  Donalee Citrin, M.D.                     DATE OF BIRTH:  02/18/1971   DATE OF CONSULTATION:  05/08/2002  DATE OF DISCHARGE:                                   CONSULTATION   REASON FOR CONSULTATION:  Wound drainage and headaches.   HISTORY OF PRESENT ILLNESS:  The patient is a very pleasant 40 year old  female, who is almost three weeks status post Chiari suboccipital  decompression, who has had progressive worsening headaches.  No documented  fevers, although been running occasional hot and cold at home with chills  and night sweats and would notice drainage on her pillow of clear fluid.  The patient denies any visual changes or numbness, pains in her arms or  legs, and has had some stomach discomfort.  She has been taking BC Powders  and is felt to have a gastritis in the ER.   PAST MEDICAL HISTORY:  1. Gastroesophageal reflux.  2. She has had previous right oophorectomy as well as back surgery, a     laminectomy.   PHYSICAL EXAMINATION:  GENERAL:  She is an awake, alert, and oriented 81-  year-old female in no apparent distress.  HEENT:  Normal.  NECK:  Supple.  LUNGS:  Clear.  HEART:  Regular.  NEUROLOGIC:  Pupils equal, round, and reactive to light.  Extraocular  movements are intact  Cranial nerves are intact.  Strength is 5/5.  No  pronator drift.   RADIOLOGY:  The CT scan obtained of her head shows a normal __________.  No  evidence of pathologic fluid collection.   IMPRESSION AND PLAN:  Her incision shows a small area of separation in the  upper part of her incision that may be evidence of a small, superficial  infection but also is showing signs of previous clear fluid drainage.  No  fluid is able to be aspirated now.  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  We will oversew the superior part of the  incision.  We will place her on steroids as well as we will place on her on  antibiotics to treat the superficial aspect of the infection.  We will have  the patient have close follow-up with Sherilyn Cooter A. Pool, M.D. and call us if she  has any additional problems.                                                Jillyn Hidden  Wynetta Emery, M.D.    GC/MEDQ  D:  05/08/2002  T:  05/08/2002  Job:  [id number]

## 2010-10-26 NOTE — Discharge Summary (Signed)
NAME:  Beth Kennedy, Beth Kennedy                        ACCOUNT NO.:  [id number]   MEDICAL RECORD NO.:  [id number]                   PATIENT TYPE:  INP   LOCATION:  5733                                 FACILITY:  [facility]   PHYSICIAN:  Sandria Bales. Ezzard Standing, M.D.               DATE OF BIRTH:  Oct 27, 1970   DATE OF ADMISSION:  06/05/2002  DATE OF DISCHARGE:  06/07/2002                                 DISCHARGE SUMMARY   DISCHARGE DIAGNOSES:  1. Acute edematous cholecystitis with cholelithiasis.  2. Status post craniotomy with some balance instability postoperatively.  3. History of lumbar disk surgery.   OPERATION PERFORMED:  The patient underwent a laparoscopic cholecystectomy  with intraoperative cholangiogram on the 28th of December of 2003.   HISTORY OF ILLNESS:  This is a 40 year old black female who sees Dr. Maurice March at [facility]  She has had about a one-week history of  abdominal pain and was actually seen in the [facility] Emergency Room on  the 24th of December of 2003.  At that time, apparently her workup was  negative.  She represented to the [facility] Emergency Room on the 27th of  December and was, by ultrasound, found to have an acute cholecystitis by  ultrasound with gallstones.   She significantly had a lumbar laminectomy by Dr. Kathaleen Maser. Pool in August of  2003.  As best I can tell, she has done fairly well with this, but then she  had a Chiari malformation surgery requiring a C1 laminectomy with dural  patch on the 5th of November 2003 and she has struggled getting over this  surgery and still actually has some dizziness and gait instability.   PHYSICAL EXAMINATION:  On physical exam, her temperature is 99, blood  pressure 116/57, pulse is 99.  She is tender in the right upper quadrant.   LABORATORY DATA:  Again, a review of her ultrasound showed gallstones with a  thickened gallbladder wall.   She had a white blood count of 5000.   HOSPITAL COURSE:  She was  admitted, placed on IV fluids and IV antibiotics  and the following morning, taken to the operating room, where she underwent  a laparoscopic cholecystectomy with intraoperative cholangiogram for an  acute edematous gallbladder with an empyema of the gallbladder.   Postoperatively, she has done well.  She is now one day postop, taking  liquids and ready for discharge.   DISCHARGE INSTRUCTIONS:  Her discharge instructions will include Vicodin for  pain.  I told her really to hold off on her other pain medicines as best she  could, stay on a low-fat diet.  She can take Benadryl for allergies.  She  can remove her bandages and get these wet in two days.  She is to see me  back in two weeks.   She already has an appointment to see Dr. Dow Adolph tomorrow at  [facility] and  she is supposed to see Dr. Altamease Oiler in two to three days.   CONDITION ON DISCHARGE:  Her discharge condition is good.                                               Sandria Bales. Ezzard Standing, M.D.    DHN/MEDQ  D:  06/07/2002  T:  06/07/2002  Job:  [id number]   cc:   Sherilyn Cooter A. Pool, M.D.  [address] [place] [address]  [place] [address]  Fax: [phone]   Maurice March, M.D.  [address] [place]  [place] [address]  Fax: [phone]

## 2010-11-12 ENCOUNTER — Emergency Department (HOSPITAL_BASED_OUTPATIENT_CLINIC_OR_DEPARTMENT_OTHER)
Admission: EM | Admit: 2010-11-12 | Discharge: 2010-11-12 | Disposition: A | Discharge status: Home / Self Care | Payer: Medicare Other | Attending: Emergency Medicine | Admitting: Emergency Medicine

## 2010-11-12 DIAGNOSIS — IMO0002 Reserved for concepts with insufficient information to code with codable children: Secondary | ICD-10-CM | POA: Insufficient documentation

## 2010-11-12 DIAGNOSIS — M5430 Sciatica, unspecified side: Secondary | ICD-10-CM | POA: Insufficient documentation

## 2010-11-12 DIAGNOSIS — R209 Unspecified disturbances of skin sensation: Secondary | ICD-10-CM | POA: Insufficient documentation

## 2010-11-12 LAB — URINE MICROSCOPIC-ADD ON

## 2010-11-12 LAB — URINALYSIS, ROUTINE W REFLEX MICROSCOPIC
Glucose, UA: NEGATIVE mg/dL
Hgb urine dipstick: NEGATIVE
Protein, ur: NEGATIVE mg/dL
pH: 6 (ref 5.0–8.0)

## 2012-10-20 ENCOUNTER — Emergency Department (HOSPITAL_BASED_OUTPATIENT_CLINIC_OR_DEPARTMENT_OTHER)
Admission: EM | Admit: 2012-10-20 | Discharge: 2012-10-20 | Disposition: A | Discharge status: Home / Self Care | Payer: Medicare Other | Attending: Emergency Medicine | Admitting: Emergency Medicine

## 2012-10-20 ENCOUNTER — Encounter (HOSPITAL_BASED_OUTPATIENT_CLINIC_OR_DEPARTMENT_OTHER): Payer: Self-pay

## 2012-10-20 DIAGNOSIS — M545 Low back pain, unspecified: Secondary | ICD-10-CM | POA: Insufficient documentation

## 2012-10-20 DIAGNOSIS — G8929 Other chronic pain: Secondary | ICD-10-CM | POA: Insufficient documentation

## 2012-10-20 DIAGNOSIS — F17200 Nicotine dependence, unspecified, uncomplicated: Secondary | ICD-10-CM | POA: Insufficient documentation

## 2012-10-20 HISTORY — DX: Unspecified ectopic pregnancy without intrauterine pregnancy: O00.90

## 2012-10-20 HISTORY — DX: Other chronic pain: G89.29

## 2012-10-20 HISTORY — DX: Dorsalgia, unspecified: M54.9

## 2012-10-20 HISTORY — DX: Major depressive disorder, single episode, unspecified: F32.9

## 2012-10-20 HISTORY — DX: Depression, unspecified: F32.A

## 2012-10-20 MED ORDER — PREDNISONE 10 MG PO TABS: 20.0000 mg | ORAL_TABLET | Freq: Two times a day (BID) | ORAL | Status: DC

## 2012-10-20 MED ORDER — OXYCODONE-ACETAMINOPHEN 5-325 MG PO TABS: 2.0000 | ORAL_TABLET | ORAL | Status: DC | PRN

## 2012-10-20 NOTE — ED Notes (Signed)
Pt reports chronic back pain that has worsened over the past 3 weeks.

## 2012-10-20 NOTE — ED Provider Notes (Addendum)
History     CSN: [id number]  Arrival date & time 10/20/12  0900   First MD Initiated Contact with Patient 10/20/12 [phone]      Chief Complaint  Patient presents with  . Back Pain    (Consider location/radiation/quality/duration/timing/severity/associated sxs/prior treatment) HPI Comments: Patient with history of chronic low back pain.  Presents with complaints of worsening pain in the low back and left buttock.  She has surgery three years ago by Dr. Dutch Quint but does not have a pcp to provide a referral for her to see him again.  She has been in pt, but feels as though this has made it worse.  No new injury or trauma.  No bowel or bladder complaints.   Patient is a 42 y.o. female presenting with back pain. The history is provided by the patient.  Back Pain Location:  Lumbar spine Quality:  Stabbing Radiates to:  R thigh Pain severity:  Severe Pain is:  Same all the time Onset quality:  Sudden Timing:  Constant Progression:  Worsening Chronicity:  New Relieved by:  Nothing Worsened by:  Ambulation and palpation Ineffective treatments:  None tried Associated symptoms: no bladder incontinence, no bowel incontinence, no numbness, no paresthesias and no weakness     Past Medical History  Diagnosis Date  . Chronic back pain   . Ectopic pregnancy     Past Surgical History  Procedure Laterality Date  . Ventriculoperitoneal shunt    . Laproscopy    . Cholecystectomy    . Back surgery    . Abdominal hysterectomy      No family history on file.  History  Substance Use Topics  . Smoking status: Current Every Day Smoker -- 1.00 packs/day    Types: Cigarettes  . Smokeless tobacco: Not on file  . Alcohol Use: Yes     Comment: occasional    OB History   Grav Para Term Preterm Abortions TAB SAB Ect Mult Living                  Review of Systems  Gastrointestinal: Negative for bowel incontinence.  Genitourinary: Negative for bladder incontinence.  Musculoskeletal:  Positive for back pain.  Neurological: Negative for weakness, numbness and paresthesias.  All other systems reviewed and are negative.    Allergies  Review of patient's allergies indicates not on file.  Home Medications  No current outpatient prescriptions on file.  BP 149/96  Pulse 79  Temp(Src) 98.9 F (37.2 C) (Oral)  Resp 18  Ht 5\' 7"  (1.702 m)  Wt 175 lb (79.379 kg)  BMI 27.4 kg/m2  SpO2 97%  Physical Exam  Nursing note and vitals reviewed. Constitutional: She is oriented to person, place, and time. She appears well-developed and well-nourished. No distress.  HENT:  Head: Normocephalic and atraumatic.  Neck: Normal range of motion. Neck supple.  Musculoskeletal: Normal range of motion.  There is ttp in the right lumbar region into the right buttock.    Neurological: She is alert and oriented to person, place, and time.  DTR's are 2+ and equal in the ble.  Strength is 5/5 in the ble.  Ambulatory with a limp.    Skin: Skin is warm and dry. She is not diaphoretic.    ED Course  Procedures (including critical care time)  Labs Reviewed - No data to display No results found.   No diagnosis found.    MDM  Will treat with prednisone and percocet.  She needs follow up with  her surgeon / pcp to discuss further testing or pain management options.  Does not appear to be an emergent cause, no symptoms or exam findings concerning for cauda equina.          Geoffery Lyons, MD 10/20/12 1610  Geoffery Lyons, MD 10/20/12 [phone]

## 2013-12-07 ENCOUNTER — Emergency Department (HOSPITAL_COMMUNITY): Payer: Medicare Other

## 2013-12-07 ENCOUNTER — Emergency Department (HOSPITAL_COMMUNITY)
Admission: EM | Admit: 2013-12-07 | Discharge: 2013-12-07 | Disposition: A | Discharge status: Home / Self Care | Payer: Medicare Other | Attending: Emergency Medicine | Admitting: Emergency Medicine

## 2013-12-07 ENCOUNTER — Encounter (HOSPITAL_COMMUNITY): Payer: Self-pay | Admitting: Emergency Medicine

## 2013-12-07 DIAGNOSIS — M545 Low back pain, unspecified: Secondary | ICD-10-CM | POA: Diagnosis present

## 2013-12-07 DIAGNOSIS — IMO0002 Reserved for concepts with insufficient information to code with codable children: Secondary | ICD-10-CM | POA: Insufficient documentation

## 2013-12-07 DIAGNOSIS — F17200 Nicotine dependence, unspecified, uncomplicated: Secondary | ICD-10-CM | POA: Diagnosis not present

## 2013-12-07 DIAGNOSIS — G8929 Other chronic pain: Secondary | ICD-10-CM | POA: Insufficient documentation

## 2013-12-07 DIAGNOSIS — M546 Pain in thoracic spine: Secondary | ICD-10-CM | POA: Insufficient documentation

## 2013-12-07 DIAGNOSIS — F329 Major depressive disorder, single episode, unspecified: Secondary | ICD-10-CM | POA: Insufficient documentation

## 2013-12-07 DIAGNOSIS — Z9889 Other specified postprocedural states: Secondary | ICD-10-CM | POA: Diagnosis not present

## 2013-12-07 DIAGNOSIS — Z79899 Other long term (current) drug therapy: Secondary | ICD-10-CM | POA: Diagnosis not present

## 2013-12-07 DIAGNOSIS — F3289 Other specified depressive episodes: Secondary | ICD-10-CM | POA: Diagnosis not present

## 2013-12-07 MED ORDER — DIAZEPAM 5 MG PO TABS: 5.0000 mg | ORAL_TABLET | Freq: Three times a day (TID) | ORAL | Status: DC | PRN

## 2013-12-07 MED ORDER — OXYCODONE-ACETAMINOPHEN 5-325 MG PO TABS
1.0000 | ORAL_TABLET | Freq: Once | ORAL | Status: AC
  Administered 2013-12-07: 1 via ORAL
  Filled 2013-12-07: qty 1

## 2013-12-07 MED ORDER — ONDANSETRON 4 MG PO TBDP
8.0000 mg | ORAL_TABLET | Freq: Once | ORAL | Status: AC
  Administered 2013-12-07: 8 mg via ORAL
  Filled 2013-12-07: qty 2

## 2013-12-07 MED ORDER — OXYCODONE-ACETAMINOPHEN 5-325 MG PO TABS: 1.0000 | ORAL_TABLET | ORAL | Status: DC | PRN

## 2013-12-07 MED ORDER — HYDROMORPHONE HCL PF 1 MG/ML IJ SOLN
2.0000 mg | Freq: Once | INTRAMUSCULAR | Status: AC
  Administered 2013-12-07: 2 mg via INTRAMUSCULAR
  Filled 2013-12-07: qty 2

## 2013-12-07 MED ORDER — KETOROLAC TROMETHAMINE 30 MG/ML IJ SOLN
60.0000 mg | Freq: Once | INTRAMUSCULAR | Status: AC
  Administered 2013-12-07: 60 mg via INTRAMUSCULAR
  Filled 2013-12-07: qty 2

## 2013-12-07 MED ORDER — ALUM & MAG HYDROXIDE-SIMETH 200-200-20 MG/5ML PO SUSP
30.0000 mL | Freq: Once | ORAL | Status: AC
  Administered 2013-12-07: 30 mL via ORAL
  Filled 2013-12-07: qty 30

## 2013-12-07 NOTE — ED Notes (Signed)
Pt c/o 'stomach burning'. Reported to PA.

## 2013-12-07 NOTE — ED Provider Notes (Signed)
CSN: [id number]     Arrival date & time 12/07/13  0950 History  This chart was scribed for non-physician practitioner, Trixie DredgeEmily West, PA-C, working with Ethelda ChickMartha K Linker, MD by Shari HeritageAisha Amuda, ED Scribe. This patient was seen in room TR11C/TR11C and the patient's care was started at 10:40 AM.    Chief Complaint  Patient presents with  . Back Pain    The history is provided by the patient. No language interpreter was used.    HPI Comments: Beth Kennedy is a 43 y.o. female with history of chronic back pain and back surgeries x2 who presents to the Emergency Department complaining of severe, burning lower and mid back pain that began 2 weeks ago after a fall. Patient states that she tripped and fell down some stairs exacerbating her back pain. She reports that over the past 2 days, pain has worsened. Patient says that pain radiates into her buttocks and right hip at times. Pain is exacerbated when she sits down and is relieved mildly while standing up without moving. She has been taking ibuprofen at home without relief - she has a history of GI ulcers and bleeding due to frequent pain medication use. She has not taken narcotic pain medications on a regular basis since 2012. Patient states that recently she has had increased tingling in her extremities and pain, but chronic left lower extremity weakness that she has experienced since her surgery is unchanged. She reports associated tingling in her entire left lower extremity and right upper leg, and reports intermittent weakness in left leg. She has seen Dr. Jordan LikesPool (neurosurgery) in the past and plans to follow up with him. She denies bowel or bladder incontinence, hematuria, difficulty urinating, dysuria, urinary frequency, saddle anesthesias, or fever.    Past Medical History  Diagnosis Date  . Chronic back pain   . Ectopic pregnancy   . Depression    Past Surgical History  Procedure Laterality Date  . Ventriculoperitoneal shunt    . Laproscopy    .  Cholecystectomy    . Back surgery    . Abdominal hysterectomy     History reviewed. No pertinent family history. History  Substance Use Topics  . Smoking status: Current Every Day Smoker -- 1.00 packs/day    Types: Cigarettes  . Smokeless tobacco: Not on file  . Alcohol Use: Yes     Comment: occasional   OB History   Grav Para Term Preterm Abortions TAB SAB Ect Mult Living                 Review of Systems  Constitutional: Negative for fever.  Gastrointestinal:       Negative for bowel incontinence.  Genitourinary: Negative for dysuria, frequency, hematuria and difficulty urinating.       Negative for bladder incontinence.  Musculoskeletal: Positive for back pain.  Neurological: Positive for weakness (chronic). Negative for numbness.       Positive for tingling in entire left lower extremity and right upper leg.  All other systems reviewed and are negative.     Allergies  Morphine and related  Home Medications   Prior to Admission medications   Medication Sig Start Date End Date Taking? Authorizing Provider  lamoTRIgine (LAMICTAL) 25 MG tablet Take 25 mg by mouth daily.    Historical Provider, MD  oxyCODONE-acetaminophen (PERCOCET) 5-325 MG per tablet Take 2 tablets by mouth every 4 (four) hours as needed for pain. 10/20/12   Geoffery Lyonsouglas Delo, MD  predniSONE (DELTASONE) 10 MG  tablet Take 2 tablets (20 mg total) by mouth 2 (two) times daily. 10/20/12   Geoffery Lyonsouglas Delo, MD   Triage Vitals: BP 122/83  Pulse 97  Temp(Src) 98.3 F (36.8 C) (Oral)  Resp 17  Ht 5\' 8"  (1.727 m)  Wt 180 lb (81.647 kg)  BMI 27.38 kg/m2  SpO2 100%  Physical Exam  Nursing note and vitals reviewed. Constitutional: She appears well-developed and well-nourished. No distress.  Tearful.   HENT:  Head: Normocephalic and atraumatic.  Neck: Neck supple.  Pulmonary/Chest: Effort normal.  Musculoskeletal:       Thoracic back: She exhibits tenderness and bony tenderness.       Lumbar back: She exhibits  tenderness and bony tenderness.  Tenderness throughout thoracic and lumbar spine. Well healed surgical scars over lumbar area. Tenderness throughout left buttock.  Neurological: She is alert.   Sensation decreased in left lower extremity but intact. 5/5 strength in right leg. 5/5 strength in left leg except decrease in strength with flexing hamstring and dorsiflexion of foot (pt states that this is unchanged from baseline).  Skin: She is not diaphoretic.    ED Course  Procedures (including critical care time) DIAGNOSTIC STUDIES: Oxygen Saturation is 100% on room air, normal by my interpretation.    COORDINATION OF CARE: 10:44 AM- Patient informed of current plan for treatment and evaluation and agrees with plan at this time.    Imaging Review Dg Thoracic Spine 2 View  12/07/2013   CLINICAL DATA:  Fall.  Back pain  EXAM: THORACIC SPINE - 2 VIEW  COMPARISON:  None.  FINDINGS: There is no evidence of thoracic spine fracture. Alignment is normal. No other significant bone abnormalities are identified. Ventricular peritoneal shunt tubing overlies the right chest.  IMPRESSION: Negative.   Electronically Signed   By: Marlan Palauharles  Clark M.D.   On: 12/07/2013 11:28   Dg Lumbar Spine Complete  12/07/2013   CLINICAL DATA:  Back pain  EXAM: LUMBAR SPINE - COMPLETE 4+ VIEW  COMPARISON:  08/20/2012  FINDINGS: Normal lumbar alignment. Negative for fracture or pars defect. Moderate disc degeneration and spondylosis has progressed in the interval. Remaining disc spaces are normal.  Ventriculoperitoneal shunt tubing in the right abdomen. Cholecystectomy clips.  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 Negative for fracture.   Electronically Signed   By: Marlan Palauharles  Clark M.D.   On: 12/07/2013 11:25    12:10 PM - Pain is now 8/10.    MDM   Final diagnoses:  Low back pain potentially associated with radiculopathy    Afebrile nontoxic patient with hx 3 lumbar surgeries with fall and  increase in chronic pain.  Does have some left sided tingling and weakness at baseline, this is unchanged.  Exam was difficult initially as pain was so intense I could not perform a proper exam.  No red flags for back pain.  Pt d/c home with percocet and valium, neurosurgery (Dr Jordan LikesPool) follow up.  Discussed result, findings, treatment, and follow up  with patient.  Pt given return precautions.  Pt verbalizes understanding and agrees with plan.      I personally performed the services described in this documentation, which was scribed in my presence. The recorded information has been reviewed and is accurate.    Trixie Dredgemily West, PA-C 12/07/13 1423

## 2013-12-07 NOTE — ED Notes (Signed)
She states she has history of back pain and back surgeries. She tripped and fell 2 weeks ago and shes been having severe lower back pain since. She took ibuprofen several times with no relief. Ambulatory. Denies bowel/bladder changes

## 2013-12-07 NOTE — ED Provider Notes (Signed)
Medical screening examination/treatment/procedure(s) were performed by non-physician practitioner and as supervising physician I was immediately available for consultation/collaboration.   EKG Interpretation None       Ethelda ChickMartha K Linker, MD 12/07/13 1425

## 2013-12-07 NOTE — Discharge Instructions (Signed)
Read the information below.  Use the prescribed medication as directed.  Please discuss all new medications with your pharmacist.  Do not take additional tylenol while taking the prescribed pain medication to avoid overdose.  You may return to the Emergency Department at any time for worsening condition or any new symptoms that concern you.   If you develop fevers, loss of control of bowel or bladder, weakness or numbness in your legs, or are unable to walk, return to the ER for a recheck.    Back Pain, Adult Low back pain is very common. About 1 in 5 people have back pain.The cause of low back pain is rarely dangerous. The pain often gets better over time.About half of people with a sudden onset of back pain feel better in just 2 weeks. About 8 in 10 people feel better by 6 weeks.  CAUSES Some common causes of back pain include:  Strain of the muscles or ligaments supporting the spine.  Wear and tear (degeneration) of the spinal discs.  Arthritis.  Direct injury to the back. DIAGNOSIS Most of the time, the direct cause of low back pain is not known.However, back pain can be treated effectively even when the exact cause of the pain is unknown.Answering your caregiver's questions about your overall health and symptoms is one of the most accurate ways to make sure the cause of your pain is not dangerous. If your caregiver needs more information, he or she may order lab work or imaging tests (X-rays or MRIs).However, even if imaging tests show changes in your back, this usually does not require surgery. HOME CARE INSTRUCTIONS For many people, back pain returns.Since low back pain is rarely dangerous, it is often a condition that people can learn to [facility] their own.   Remain active. It is stressful on the back to sit or stand in one place. Do not sit, drive, or stand in one place for more than 30 minutes at a time. Take short walks on level surfaces as soon as pain allows.Try to increase  the length of time you walk each day.  Do not stay in bed.Resting more than 1 or 2 days can delay your recovery.  Do not avoid exercise or work.Your body is made to move.It is not dangerous to be active, even though your back may hurt.Your back will likely heal faster if you return to being active before your pain is gone.  Pay attention to your body when you bend and lift. Many people have less discomfortwhen lifting if they bend their knees, keep the load close to their bodies,and avoid twisting. Often, the most comfortable positions are those that put less stress on your recovering back.  Find a comfortable position to sleep. Use a firm mattress and lie on your side with your knees slightly bent. If you lie on your back, put a pillow under your knees.  Only take over-the-counter or prescription medicines as directed by your caregiver. Over-the-counter medicines to reduce pain and inflammation are often the most helpful.Your caregiver may prescribe muscle relaxant drugs.These medicines help dull your pain so you can more quickly return to your normal activities and healthy exercise.  Put ice on the injured area.  Put ice in a plastic bag.  Place a towel between your skin and the bag.  Leave the ice on for 15-20 minutes, 03-04 times a day for the first 2 to 3 days. After that, ice and heat may be alternated to reduce pain and spasms.  Ask your caregiver about trying back exercises and gentle massage. This may be of some benefit.  Avoid feeling anxious or stressed.Stress increases muscle tension and can worsen back pain.It is important to recognize when you are anxious or stressed and learn ways to manage it.Exercise is a great option. SEEK MEDICAL CARE IF:  You have pain that is not relieved with rest or medicine.  You have pain that does not improve in 1 week.  You have new symptoms.  You are generally not feeling well. SEEK IMMEDIATE MEDICAL CARE IF:   You have pain  that radiates from your back into your legs.  You develop new bowel or bladder control problems.  You have unusual weakness or numbness in your arms or legs.  You develop nausea or vomiting.  You develop abdominal pain.  You feel faint. Document Released: 05/27/2005 Document Revised: 11/26/2011 Document Reviewed: 10/15/2010 [facility] Patient Information 2015 [place], [place]. This information is not intended to replace advice given to you by your health care provider. Make sure you discuss any questions you have with your health care provider.

## 2013-12-07 NOTE — ED Notes (Signed)
Patient transported to X-ray 

## 2013-12-07 NOTE — ED Notes (Signed)
Stomach burning relieved with Maalox.

## 2013-12-07 NOTE — ED Notes (Signed)
Pt reports she fell down 3 steps 2 weeks ago. Has had gradually increasing lumbar back pain. Hx of back surgery x 3, one by Dr. Jordan LikesPool [place] [facility].  Pt is crying and is unable to sit or lay down.  Has previous paresthesia to left thigh.

## 2014-01-24 ENCOUNTER — Other Ambulatory Visit: Payer: Self-pay | Admitting: Neurosurgery

## 2014-01-24 DIAGNOSIS — M48061 Spinal stenosis, lumbar region without neurogenic claudication: Secondary | ICD-10-CM

## 2014-01-28 ENCOUNTER — Ambulatory Visit
Admission: RE | Admit: 2014-01-28 | Discharge: 2014-01-28 | Disposition: A | Discharge status: Home / Self Care | Payer: Medicare Other | Source: Ambulatory Visit | Attending: Neurosurgery | Admitting: Neurosurgery

## 2014-01-28 VITALS — BP 131/67 | HR 61

## 2014-01-28 DIAGNOSIS — M48061 Spinal stenosis, lumbar region without neurogenic claudication: Secondary | ICD-10-CM

## 2014-01-28 MED ORDER — IOHEXOL 180 MG/ML  SOLN
1.0000 mL | Freq: Once | INTRAMUSCULAR | Status: AC | PRN
  Administered 2014-01-28: 1 mL via EPIDURAL

## 2014-01-28 MED ORDER — METHYLPREDNISOLONE ACETATE 40 MG/ML INJ SUSP (RADIOLOG
120.0000 mg | Freq: Once | INTRAMUSCULAR | Status: AC
Start: 2014-01-28 — End: 2014-01-28
  Administered 2014-01-28: 120 mg via EPIDURAL

## 2014-01-28 NOTE — Discharge Instructions (Signed)

## 2014-02-22 ENCOUNTER — Other Ambulatory Visit: Payer: Self-pay | Admitting: Neurosurgery

## 2014-02-22 DIAGNOSIS — M48061 Spinal stenosis, lumbar region without neurogenic claudication: Secondary | ICD-10-CM

## 2014-03-16 ENCOUNTER — Ambulatory Visit
Admission: RE | Admit: 2014-03-16 | Discharge: 2014-03-16 | Disposition: A | Discharge status: Home / Self Care | Payer: Medicare Other | Source: Ambulatory Visit | Attending: Neurosurgery | Admitting: Neurosurgery

## 2014-03-16 ENCOUNTER — Other Ambulatory Visit: Payer: Self-pay | Admitting: Neurosurgery

## 2014-03-16 VITALS — BP 133/77 | HR 78

## 2014-03-16 DIAGNOSIS — M48061 Spinal stenosis, lumbar region without neurogenic claudication: Secondary | ICD-10-CM

## 2014-03-16 MED ORDER — METHYLPREDNISOLONE ACETATE 40 MG/ML INJ SUSP (RADIOLOG
120.0000 mg | Freq: Once | INTRAMUSCULAR | Status: AC
  Administered 2014-03-16: 120 mg via EPIDURAL

## 2014-03-16 MED ORDER — IOHEXOL 180 MG/ML  SOLN
1.0000 mL | Freq: Once | INTRAMUSCULAR | Status: AC | PRN
  Administered 2014-03-16: 1 mL via EPIDURAL

## 2014-07-09 ENCOUNTER — Emergency Department (HOSPITAL_COMMUNITY): Payer: Medicare Other

## 2014-07-09 ENCOUNTER — Emergency Department (HOSPITAL_COMMUNITY)
Admission: EM | Admit: 2014-07-09 | Discharge: 2014-07-09 | Disposition: A | Discharge status: Home / Self Care | Payer: Medicare Other | Attending: Emergency Medicine | Admitting: Emergency Medicine

## 2014-07-09 ENCOUNTER — Encounter (HOSPITAL_COMMUNITY): Payer: Self-pay | Admitting: Cardiology

## 2014-07-09 DIAGNOSIS — R519 Headache, unspecified: Secondary | ICD-10-CM

## 2014-07-09 DIAGNOSIS — R51 Headache: Secondary | ICD-10-CM

## 2014-07-09 DIAGNOSIS — J0100 Acute maxillary sinusitis, unspecified: Secondary | ICD-10-CM | POA: Diagnosis not present

## 2014-07-09 DIAGNOSIS — Z8659 Personal history of other mental and behavioral disorders: Secondary | ICD-10-CM | POA: Insufficient documentation

## 2014-07-09 DIAGNOSIS — Z720 Tobacco use: Secondary | ICD-10-CM | POA: Insufficient documentation

## 2014-07-09 DIAGNOSIS — G8929 Other chronic pain: Secondary | ICD-10-CM | POA: Insufficient documentation

## 2014-07-09 DIAGNOSIS — R52 Pain, unspecified: Secondary | ICD-10-CM

## 2014-07-09 MED ORDER — HYDROMORPHONE HCL 1 MG/ML IJ SOLN
0.5000 mg | Freq: Once | INTRAMUSCULAR | Status: AC
  Administered 2014-07-09: 0.5 mg via INTRAVENOUS
  Filled 2014-07-09: qty 1

## 2014-07-09 MED ORDER — KETOROLAC TROMETHAMINE 30 MG/ML IJ SOLN
30.0000 mg | Freq: Once | INTRAMUSCULAR | Status: AC
  Administered 2014-07-09: 30 mg via INTRAVENOUS
  Filled 2014-07-09: qty 1

## 2014-07-09 MED ORDER — CETIRIZINE-PSEUDOEPHEDRINE ER 5-120 MG PO TB12: 1.0000 | ORAL_TABLET | Freq: Two times a day (BID) | ORAL | Status: DC | PRN

## 2014-07-09 MED ORDER — AMOXICILLIN 500 MG PO CAPS: 500.0000 mg | ORAL_CAPSULE | Freq: Three times a day (TID) | ORAL | Status: DC

## 2014-07-09 MED ORDER — METOCLOPRAMIDE HCL 5 MG/ML IJ SOLN
10.0000 mg | Freq: Once | INTRAMUSCULAR | Status: AC
  Administered 2014-07-09: 10 mg via INTRAVENOUS
  Filled 2014-07-09: qty 2

## 2014-07-09 MED ORDER — HYDROMORPHONE HCL 1 MG/ML IJ SOLN
1.0000 mg | Freq: Once | INTRAMUSCULAR | Status: AC
  Administered 2014-07-09: 1 mg via INTRAVENOUS
  Filled 2014-07-09: qty 1

## 2014-07-09 NOTE — ED Notes (Signed)
Patient given ginger ale per MD's approval

## 2014-07-09 NOTE — ED Notes (Signed)
EDP at bedside  

## 2014-07-09 NOTE — Discharge Instructions (Signed)
It was our pleasure to provide your ER care today - we hope that you feel better.  Rest. Drink plenty of fluids  Take antibiotic (amoxicillin) as prescribed.  Take zyrtec d as need for congestion.  Take motrin or aleve as need for pain.  Follow up with primary care doctor in coming week if symptoms fail to improve/resolve.  Return to ER if worse, new symptoms, high fevers, severe head pain, persistent vomiting, other concern.  You were given pain medication in the ER - no driving for the next 4 hours.     Sinus Headache A sinus headache is when your sinuses become clogged or swollen. Sinus headaches can range from mild to severe.  CAUSES A sinus headache can have different causes, such as:  Colds.  Sinus infections.  Allergies. SYMPTOMS  Symptoms of a sinus headache may vary and can include:  Headache.  Pain or pressure in the face.  Congested or runny nose.  Fever.  Inability to smell.  Pain in upper teeth. Weather changes can make symptoms worse. TREATMENT  The treatment of a sinus headache depends on the cause.  Sinus pain caused by a sinus infection may be treated with antibiotic medicine.  Sinus pain caused by allergies may be helped by allergy medicines (antihistamines) and medicated nasal sprays.  Sinus pain caused by congestion may be helped by flushing the nose and sinuses with saline solution. HOME CARE INSTRUCTIONS   If antibiotics are prescribed, take them as directed. Finish them even if you start to feel better.  Only take over-the-counter or prescription medicines for pain, discomfort, or fever as directed by your caregiver.  If you have congestion, use a nasal spray to help reduce pressure. SEEK IMMEDIATE MEDICAL CARE IF:  You have a fever.  You have headaches more than once a week.  You have sensitivity to light or sound.  You have repeated nausea and vomiting.  You have vision problems.  You have sudden, severe pain in your face  or head.  You have a seizure.  You are confused.  Your sinus headaches do not get better after treatment. Many people think they have a sinus headache when they actually have migraines or tension headaches. MAKE SURE YOU:   Understand these instructions.  Will watch your condition.  Will get help right away if you are not doing well or get worse. Document Released: 07/04/2004 Document Revised: 08/19/2011 Document Reviewed: 08/25/2010 [facility] Patient Information 2015 [place], [place]. This information is not intended to replace advice given to you by your health care provider. Make sure you discuss any questions you have with your health care provider.    Sinusitis Sinusitis is redness, soreness, and inflammation of the paranasal sinuses. Paranasal sinuses are air pockets within the bones of your face (beneath the eyes, the middle of the forehead, or above the eyes). In healthy paranasal sinuses, mucus is able to drain out, and air is able to circulate through them by way of your nose. However, when your paranasal sinuses are inflamed, mucus and air can become trapped. This can allow bacteria and other germs to grow and cause infection. Sinusitis can develop quickly and last only a short time (acute) or continue over a long period (chronic). Sinusitis that lasts for more than 12 weeks is considered chronic.  CAUSES  Causes of sinusitis include:  Allergies.  Structural abnormalities, such as displacement of the cartilage that separates your nostrils (deviated septum), which can decrease the air flow through your nose and  sinuses and affect sinus drainage.  Functional abnormalities, such as when the small hairs (cilia) that line your sinuses and help remove mucus do not work properly or are not present. SIGNS AND SYMPTOMS  Symptoms of acute and chronic sinusitis are the same. The primary symptoms are pain and pressure around the affected sinuses. Other symptoms include:  Upper  toothache.  Earache.  Headache.  Bad breath.  Decreased sense of smell and taste.  A cough, which worsens when you are lying flat.  Fatigue.  Fever.  Thick drainage from your nose, which often is green and may contain pus (purulent).  Swelling and warmth over the affected sinuses. DIAGNOSIS  Your health care provider will perform a physical exam. During the exam, your health care provider may:  Look in your nose for signs of abnormal growths in your nostrils (nasal polyps).  Tap over the affected sinus to check for signs of infection.  View the inside of your sinuses (endoscopy) using an imaging device that has a light attached (endoscope). If your health care provider suspects that you have chronic sinusitis, one or more of the following tests may be recommended:  Allergy tests.  Nasal culture. A sample of mucus is taken from your nose, sent to a lab, and screened for bacteria.  Nasal cytology. A sample of mucus is taken from your nose and examined by your health care provider to determine if your sinusitis is related to an allergy. TREATMENT  Most cases of acute sinusitis are related to a viral infection and will resolve on their own within 10 days. Sometimes medicines are prescribed to help relieve symptoms (pain medicine, decongestants, nasal steroid sprays, or saline sprays).  However, for sinusitis related to a bacterial infection, your health care provider will prescribe antibiotic medicines. These are medicines that will help kill the bacteria causing the infection.  Rarely, sinusitis is caused by a fungal infection. In theses cases, your health care provider will prescribe antifungal medicine. For some cases of chronic sinusitis, surgery is needed. Generally, these are cases in which sinusitis recurs more than 3 times per year, despite other treatments. HOME CARE INSTRUCTIONS   Drink plenty of water. Water helps thin the mucus so your sinuses can drain more  easily.  Use a humidifier.  Inhale steam 3 to 4 times a day (for example, sit in the bathroom with the shower running).  Apply a warm, moist washcloth to your face 3 to 4 times a day, or as directed by your health care provider.  Use saline nasal sprays to help moisten and clean your sinuses.  Take medicines only as directed by your health care provider.  If you were prescribed either an antibiotic or antifungal medicine, finish it all even if you start to feel better. SEEK IMMEDIATE MEDICAL CARE IF:  You have increasing pain or severe headaches.  You have nausea, vomiting, or drowsiness.  You have swelling around your face.  You have vision problems.  You have a stiff neck.  You have difficulty breathing. MAKE SURE YOU:   Understand these instructions.  Will watch your condition.  Will get help right away if you are not doing well or get worse. Document Released: 05/27/2005 Document Revised: 10/11/2013 Document Reviewed: 06/11/2011 [facility] Patient Information 2015 [place], [place]. This information is not intended to replace advice given to you by your health care provider. Make sure you discuss any questions you have with your health care provider.

## 2014-07-09 NOTE — ED Notes (Signed)
Pt reports she started having a headache about 3 days ago. Reports that she has 3 brain surgeries and tends to get headache like this. Reports Dutch Quintoole is her neuro MD.

## 2014-07-09 NOTE — ED Provider Notes (Addendum)
CSN: [id number]     Arrival date & time 07/09/14  1203 History   First MD Initiated Contact with Patient 07/09/14 1233     Chief Complaint  Patient presents with  . Headache     (Consider location/radiation/quality/duration/timing/severity/associated sxs/prior Treatment) Patient is a 44 y.o. female presenting with headaches. The history is provided by the patient.  Headache Associated symptoms: congestion and sinus pressure   Associated symptoms: no abdominal pain, no cough, no diarrhea, no pain, no fever, no neck pain, no neck stiffness, no numbness, no sore throat and no vomiting   pt c/o onset dull frontal headache 3 days ago.  Indicates remote hx 3 prior surgeries related to decompression of Chiari malformation.  Last surgery was 10+ years ago. States 3 day ago gradual onset mild, dull, headache. Headache has persisted since, slowly worse. No acute or abrupt change. Not her worse headache, although denies frequent headaches. No neck pain or stiffness. No eye pain or change in vision. No numbness/weakness. No problems w balance, coordination, or normal functional ability. No recent head trauma. No fever or chills. States when she gets headaches like this, usually occurs in winter, and at times associated with sinus congestion - pt has noted recent increased sinus pressure/pain, although denies purulent nasal drainage.  No recent head injury, trauma or fall. No syncope. No positional change to headache, or change w time of day or activity.     Past Medical History  Diagnosis Date  . Chronic back pain   . Ectopic pregnancy   . Depression    Past Surgical History  Procedure Laterality Date  . Ventriculoperitoneal shunt    . Laproscopy    . Cholecystectomy    . Back surgery    . Abdominal hysterectomy     History reviewed. No pertinent family history. History  Substance Use Topics  . Smoking status: Current Every Day Smoker -- 1.00 packs/day    Types: Cigarettes  . Smokeless  tobacco: Not on file  . Alcohol Use: Yes     Comment: occasional   OB History    No data available     Review of Systems  Constitutional: Negative for fever and chills.  HENT: Positive for congestion and sinus pressure. Negative for sore throat.   Eyes: Negative for pain, redness and visual disturbance.  Respiratory: Negative for cough and shortness of breath.   Cardiovascular: Negative for chest pain.  Gastrointestinal: Negative for vomiting, abdominal pain and diarrhea.  Endocrine: Negative for polyuria.  Genitourinary: Negative for dysuria and flank pain.  Musculoskeletal: Negative for neck pain and neck stiffness.  Skin: Negative for rash.  Neurological: Positive for headaches. Negative for syncope, speech difficulty, weakness and numbness.  Hematological: Does not bruise/bleed easily.  Psychiatric/Behavioral: Negative for confusion.      Allergies  Morphine and related and Percocet  Home Medications   Prior to Admission medications   Medication Sig Start Date End Date Taking? Authorizing Provider  diazepam (VALIUM) 5 MG tablet Take 1 tablet (5 mg total) by mouth every 8 (eight) hours as needed for muscle spasms (pain). 12/07/13   Trixie DredgeEmily West, PA-C  ibuprofen (ADVIL,MOTRIN) 200 MG tablet Take 600 mg by mouth every 6 (six) hours as needed (pain).    Historical Provider, MD  oxyCODONE-acetaminophen (PERCOCET/ROXICET) 5-325 MG per tablet Take 1-2 tablets by mouth every 4 (four) hours as needed for moderate pain or severe pain. 12/07/13   Trixie DredgeEmily West, PA-C   BP 125/89 mmHg  Pulse 86  Temp(Src)  98.2 F (36.8 C)  Resp 18  Ht  (1.702 m)  Wt 185 lb (83.915 kg)  BMI 28.97 kg/m2  SpO2 99% Physical Exam  Constitutional: She is oriented to person, place, and time. She appears well-developed and well-nourished. No distress.  HENT:  Head: Atraumatic.  Nose: Nose normal.  Mouth/Throat: Oropharynx is clear and moist.  No focal temporal tenderness.  +maxillary and frontal  sinus pressure/pain.   Eyes: Conjunctivae and EOM are normal. Pupils are equal, round, and reactive to light. No scleral icterus.  Neck: Neck supple. No tracheal deviation present. No thyromegaly present.  No stiffness or rigidity.   Cardiovascular: Normal rate, regular rhythm, normal heart sounds and intact distal pulses.  Exam reveals no gallop and no friction rub.   No murmur heard. Pulmonary/Chest: Effort normal and breath sounds normal. No respiratory distress.  Abdominal: Soft. Normal appearance and bowel sounds are normal. She exhibits no distension. There is no tenderness.  Genitourinary:  No cva tenderness.  Musculoskeletal: Normal range of motion. She exhibits no edema or tenderness.  Lymphadenopathy:    She has no cervical adenopathy.  Neurological: She is alert and oriented to person, place, and time. No cranial nerve deficit.  Motor intact bilaterally. stre 5/5. sens grossly intact.  Steady gait.   Skin: Skin is warm and dry. No rash noted. She is not diaphoretic.  Psychiatric: She has a normal mood and affect.  Nursing note and vitals reviewed.   ED Course  Procedures (including critical care time) Labs Review  Ct Head Wo Contrast  07/09/2014   CLINICAL DATA:  44 year old female with worsening nausea, vomiting and headache for the past 3 days.  EXAM: CT HEAD WITHOUT CONTRAST  TECHNIQUE: Contiguous axial images were obtained from the base of the skull through the vertex without intravenous contrast.  COMPARISON:  Head CT 07/12/2013.  FINDINGS: Right parietal ventriculostomy shunt catheter extends across the midline with tip terminating in the left lateral ventricle. No hydrocephalus. No acute intracranial abnormalities. Specifically, no evidence of acute intracranial hemorrhage, no definite findings of acute/subacute cerebral ischemia, no mass, mass effect or abnormal intra or extra-axial fluid collections. Visualized mastoids are well pneumatized. Paranasal sinuses are  remarkable for some mild multifocal mucosal thickening throughout the ethmoid sinuses and maxillary sinuses bilaterally, and a small air-fluid level in the right maxillary sinus. No acute displaced skull fractures are identified.  IMPRESSION: 1. No acute intracranial abnormalities. 2. Right parietal ventriculostomy shunt catheter is stable in position. No hydrocephalus. 3. Paranasal sinus disease, as above, including a small air-fluid level in the right maxillary sinus, which may suggest acute sinusitis.   Electronically Signed   By: Trudie Reed M.D.   On: 07/09/2014 14:07       MDM   Iv ns bolus. Dilaudid iv, reglan iv.  Reviewed nursing notes and prior charts for additional history.   Pt notes improved in pain w above meds.   No fevers, or acute neurologic symptoms or findings on exam. No neck pain or stiffness.  +recent sinus congestion/pressure and findings c/w sinusitis on exam - as such, will rx.  Pain currently appears stable for d/c. Return precautions discussed.   Recheck headache remains resolved.     Suzi Roots, MD 07/09/14 [phone]

## 2014-09-21 ENCOUNTER — Encounter (HOSPITAL_COMMUNITY): Payer: Self-pay | Admitting: Family Medicine

## 2014-09-21 ENCOUNTER — Emergency Department (HOSPITAL_COMMUNITY)
Admission: EM | Admit: 2014-09-21 | Discharge: 2014-09-21 | Disposition: A | Discharge status: Home / Self Care | Payer: Medicare Other | Attending: Emergency Medicine | Admitting: Emergency Medicine

## 2014-09-21 ENCOUNTER — Emergency Department (HOSPITAL_COMMUNITY): Payer: Medicare Other

## 2014-09-21 DIAGNOSIS — Z3202 Encounter for pregnancy test, result negative: Secondary | ICD-10-CM | POA: Insufficient documentation

## 2014-09-21 DIAGNOSIS — R1010 Upper abdominal pain, unspecified: Secondary | ICD-10-CM | POA: Diagnosis present

## 2014-09-21 DIAGNOSIS — K529 Noninfective gastroenteritis and colitis, unspecified: Secondary | ICD-10-CM | POA: Insufficient documentation

## 2014-09-21 DIAGNOSIS — Z792 Long term (current) use of antibiotics: Secondary | ICD-10-CM | POA: Diagnosis not present

## 2014-09-21 DIAGNOSIS — Z79899 Other long term (current) drug therapy: Secondary | ICD-10-CM | POA: Insufficient documentation

## 2014-09-21 DIAGNOSIS — Z91040 Latex allergy status: Secondary | ICD-10-CM | POA: Diagnosis not present

## 2014-09-21 DIAGNOSIS — R109 Unspecified abdominal pain: Secondary | ICD-10-CM

## 2014-09-21 DIAGNOSIS — Z90710 Acquired absence of both cervix and uterus: Secondary | ICD-10-CM | POA: Diagnosis not present

## 2014-09-21 DIAGNOSIS — Z720 Tobacco use: Secondary | ICD-10-CM | POA: Insufficient documentation

## 2014-09-21 DIAGNOSIS — Z9049 Acquired absence of other specified parts of digestive tract: Secondary | ICD-10-CM | POA: Diagnosis not present

## 2014-09-21 DIAGNOSIS — F329 Major depressive disorder, single episode, unspecified: Secondary | ICD-10-CM | POA: Insufficient documentation

## 2014-09-21 LAB — URINALYSIS, ROUTINE W REFLEX MICROSCOPIC
Bilirubin Urine: NEGATIVE
GLUCOSE, UA: NEGATIVE mg/dL
Hgb urine dipstick: NEGATIVE
Ketones, ur: NEGATIVE mg/dL
LEUKOCYTES UA: NEGATIVE
Nitrite: NEGATIVE
PH: 6 (ref 5.0–8.0)
PROTEIN: NEGATIVE mg/dL
Specific Gravity, Urine: 1.021 (ref 1.005–1.030)
Urobilinogen, UA: 0.2 mg/dL (ref 0.0–1.0)

## 2014-09-21 LAB — CBC WITH DIFFERENTIAL/PLATELET
Basophils Absolute: 0 10*3/uL (ref 0.0–0.1)
Basophils Relative: 0 % (ref 0–1)
EOS ABS: 0.1 10*3/uL (ref 0.0–0.7)
Eosinophils Relative: 1 % (ref 0–5)
HCT: 42.9 % (ref 36.0–46.0)
Hemoglobin: 14.3 g/dL (ref 12.0–15.0)
Lymphocytes Relative: 9 % — ABNORMAL LOW (ref 12–46)
Lymphs Abs: 0.9 10*3/uL (ref 0.7–4.0)
MCH: 28.9 pg (ref 26.0–34.0)
MCHC: 33.3 g/dL (ref 30.0–36.0)
MCV: 86.7 fL (ref 78.0–100.0)
MONOS PCT: 7 % (ref 3–12)
Monocytes Absolute: 0.8 10*3/uL (ref 0.1–1.0)
NEUTROS PCT: 83 % — AB (ref 43–77)
Neutro Abs: 9 10*3/uL — ABNORMAL HIGH (ref 1.7–7.7)
PLATELETS: 204 10*3/uL (ref 150–400)
RBC: 4.95 MIL/uL (ref 3.87–5.11)
RDW: 13.2 % (ref 11.5–15.5)
WBC: 10.8 10*3/uL — ABNORMAL HIGH (ref 4.0–10.5)

## 2014-09-21 LAB — COMPREHENSIVE METABOLIC PANEL
ALT: 9 U/L (ref 0–35)
AST: 18 U/L (ref 0–37)
Albumin: 3.9 g/dL (ref 3.5–5.2)
Alkaline Phosphatase: 47 U/L (ref 39–117)
Anion gap: 10 (ref 5–15)
BILIRUBIN TOTAL: 0.5 mg/dL (ref 0.3–1.2)
BUN: 10 mg/dL (ref 6–23)
CHLORIDE: 104 mmol/L (ref 96–112)
CO2: 23 mmol/L (ref 19–32)
Calcium: 9.3 mg/dL (ref 8.4–10.5)
Creatinine, Ser: 0.66 mg/dL (ref 0.50–1.10)
Glucose, Bld: 106 mg/dL — ABNORMAL HIGH (ref 70–99)
Potassium: 4.4 mmol/L (ref 3.5–5.1)
Sodium: 137 mmol/L (ref 135–145)
Total Protein: 6.9 g/dL (ref 6.0–8.3)

## 2014-09-21 LAB — POC URINE PREG, ED: PREG TEST UR: NEGATIVE

## 2014-09-21 LAB — LIPASE, BLOOD: LIPASE: 20 U/L (ref 11–59)

## 2014-09-21 MED ORDER — IOHEXOL 300 MG/ML  SOLN
100.0000 mL | Freq: Once | INTRAMUSCULAR | Status: AC | PRN
  Administered 2014-09-21: 100 mL via INTRAVENOUS

## 2014-09-21 MED ORDER — CIPROFLOXACIN HCL 500 MG PO TABS: 500.0000 mg | ORAL_TABLET | Freq: Two times a day (BID) | ORAL | Status: DC

## 2014-09-21 MED ORDER — ONDANSETRON HCL 4 MG/2ML IJ SOLN
4.0000 mg | Freq: Once | INTRAMUSCULAR | Status: AC
  Administered 2014-09-21: 4 mg via INTRAVENOUS
  Filled 2014-09-21: qty 2

## 2014-09-21 MED ORDER — IOHEXOL 300 MG/ML  SOLN
25.0000 mL | Freq: Once | INTRAMUSCULAR | Status: AC | PRN
  Administered 2014-09-21: 25 mL via ORAL

## 2014-09-21 MED ORDER — HYDROMORPHONE HCL 1 MG/ML IJ SOLN
0.5000 mg | Freq: Once | INTRAMUSCULAR | Status: AC
  Administered 2014-09-21: 0.5 mg via INTRAVENOUS
  Filled 2014-09-21: qty 1

## 2014-09-21 MED ORDER — HYDROMORPHONE HCL 1 MG/ML IJ SOLN
1.0000 mg | Freq: Once | INTRAMUSCULAR | Status: AC
  Administered 2014-09-21: 1 mg via INTRAVENOUS
  Filled 2014-09-21: qty 1

## 2014-09-21 MED ORDER — PANTOPRAZOLE SODIUM 40 MG IV SOLR
40.0000 mg | Freq: Once | INTRAVENOUS | Status: AC
  Administered 2014-09-21: 40 mg via INTRAVENOUS
  Filled 2014-09-21: qty 40

## 2014-09-21 MED ORDER — ONDANSETRON 8 MG PO TBDP: 8.0000 mg | ORAL_TABLET | Freq: Three times a day (TID) | ORAL | Status: DC | PRN

## 2014-09-21 MED ORDER — ONDANSETRON HCL 4 MG/2ML IJ SOLN
4.0000 mg | Freq: Once | INTRAMUSCULAR | Status: DC
  Filled 2014-09-21: qty 2

## 2014-09-21 MED ORDER — METRONIDAZOLE IN NACL 5-0.79 MG/ML-% IV SOLN
500.0000 mg | Freq: Once | INTRAVENOUS | Status: AC
  Administered 2014-09-21: 500 mg via INTRAVENOUS
  Filled 2014-09-21: qty 100

## 2014-09-21 MED ORDER — ONDANSETRON 4 MG PO TBDP
8.0000 mg | ORAL_TABLET | Freq: Once | ORAL | Status: AC
  Administered 2014-09-21: 8 mg via ORAL
  Filled 2014-09-21: qty 2

## 2014-09-21 MED ORDER — TRAMADOL HCL 50 MG PO TABS
50.0000 mg | ORAL_TABLET | Freq: Four times a day (QID) | ORAL | Status: DC | PRN
Start: 2014-09-21 — End: 2015-04-08

## 2014-09-21 MED ORDER — CIPROFLOXACIN IN D5W 400 MG/200ML IV SOLN
400.0000 mg | Freq: Once | INTRAVENOUS | Status: AC
  Administered 2014-09-21: 400 mg via INTRAVENOUS
  Filled 2014-09-21: qty 200

## 2014-09-21 MED ORDER — METRONIDAZOLE 500 MG PO TABS: 500.0000 mg | ORAL_TABLET | Freq: Four times a day (QID) | ORAL | Status: DC

## 2014-09-21 NOTE — ED Provider Notes (Signed)
CSN: [id number]     Arrival date & time 09/21/14  1225 History   First MD Initiated Contact with Patient 09/21/14 1356     Chief Complaint  Patient presents with  . Abdominal Pain     (Consider location/radiation/quality/duration/timing/severity/associated sxs/prior Treatment) Patient is a 44 y.o. female presenting with abdominal pain. The history is provided by the patient.  Abdominal Pain Associated symptoms: no chest pain, no chills, no constipation, no cough, no diarrhea, no dysuria, no fever, no shortness of breath, no sore throat and no vomiting   pt c/o upper abdominal pain for the past couple days. Pain constant, dull, moderate, non radiating. States feels similar to prior pain w diverticulitis. Denies hx pud or pancreatitis. Remote hx cholecystectomy. Nausea. No vomiting. Had normal bm today. No abd distension. No fever or chills. No back or flank pain. No chest pain or sob. No cough or uri c/o. Prior abd surgery also includes hysterectomy. No lower abd pain. No vaginal discharge or bleeding. No dysuria or gu c/o.     Past Medical History  Diagnosis Date  . Chronic back pain   . Ectopic pregnancy   . Depression    Past Surgical History  Procedure Laterality Date  . Ventriculoperitoneal shunt    . Laproscopy    . Cholecystectomy    . Back surgery    . Abdominal hysterectomy     History reviewed. No pertinent family history. History  Substance Use Topics  . Smoking status: Current Every Day Smoker -- 1.00 packs/day    Types: Cigarettes  . Smokeless tobacco: Not on file  . Alcohol Use: Yes     Comment: occasional   OB History    No data available     Review of Systems  Constitutional: Negative for fever and chills.  HENT: Negative for sore throat.   Eyes: Negative for redness.  Respiratory: Negative for cough and shortness of breath.   Cardiovascular: Negative for chest pain.  Gastrointestinal: Positive for abdominal pain. Negative for vomiting, diarrhea and  constipation.  Endocrine: Negative for polyuria.  Genitourinary: Negative for dysuria and flank pain.  Musculoskeletal: Negative for back pain and neck pain.  Skin: Negative for rash.  Neurological: Negative for headaches.  Hematological: Does not bruise/bleed easily.  Psychiatric/Behavioral: Negative for confusion.      Allergies  Buprenorphine hcl; Morphine and related; Latex; and Percocet  Home Medications   Prior to Admission medications   Medication Sig Start Date End Date Taking? Authorizing Provider  amoxicillin (AMOXIL) 500 MG capsule Take 1 capsule (500 mg total) by mouth 3 (three) times daily. 07/09/14   Cathren LaineKevin Sumer [place], MD  cetirizine (ZYRTEC) 10 MG tablet Take 10 mg by mouth daily as needed. 07/21/14   Historical Provider, MD  cetirizine-pseudoephedrine (ZYRTEC-D) 5-120 MG per tablet Take 1 tablet by mouth 2 (two) times daily as needed for allergies. 07/09/14   Cathren LaineKevin Obdulia Steier, MD  diazepam (VALIUM) 5 MG tablet Take 1 tablet (5 mg total) by mouth every 8 (eight) hours as needed for muscle spasms (pain). Patient not taking: Reported on 07/09/2014 12/07/13   Trixie DredgeEmily West, PA-C  gabapentin (NEURONTIN) 100 MG capsule Take 100 mg by mouth 3 (three) times daily. 09/16/14   Historical Provider, MD  HYDROcodone-acetaminophen (NORCO/VICODIN) 5-325 MG per tablet Take by mouth 4 (four) times daily as needed. For pain 09/16/14   Historical Provider, MD  ibuprofen (ADVIL,MOTRIN) 200 MG tablet Take 600 mg by mouth every 6 (six) hours as needed (pain).  Historical Provider, MD  lamoTRIgine (LAMICTAL) 100 MG tablet Take 100 mg by mouth 2 (two) times daily. 08/13/12   Historical Provider, MD  Misc Natural Products (DANDELION ROOT PO) Take 1 capsule by mouth daily.    Historical Provider, MD  oxyCODONE-acetaminophen (PERCOCET/ROXICET) 5-325 MG per tablet Take 1-2 tablets by mouth every 4 (four) hours as needed for moderate pain or severe pain. Patient not taking: Reported on 07/09/2014 12/07/13   Trixie Dredge,  PA-C  zolpidem (AMBIEN) 10 MG tablet Take 10 mg by mouth at bedtime. 06/30/14   Historical Provider, MD   BP 115/85 mmHg  Pulse 96  Temp(Src) 98.3 F (36.8 C)  Resp 16  SpO2 94% Physical Exam  Constitutional: She appears well-developed and well-nourished. No distress.  HENT:  Mouth/Throat: Oropharynx is clear and moist.  Eyes: Conjunctivae are normal. No scleral icterus.  Neck: Neck supple. No tracheal deviation present.  Cardiovascular: Normal rate, regular rhythm, normal heart sounds and intact distal pulses.  Exam reveals no gallop and no friction rub.   No murmur heard. Pulmonary/Chest: Effort normal and breath sounds normal. No respiratory distress.  Abdominal: Soft. Normal appearance and bowel sounds are normal. She exhibits no distension and no mass. There is tenderness. There is no rebound and no guarding.  Epigastric tenderness, no rebound or guarding. No incarc hernia.   Genitourinary:  No cva tenderness  Musculoskeletal: She exhibits no edema.  Neurological: She is alert.  Skin: Skin is warm and dry. No rash noted. She is not diaphoretic.  Psychiatric: She has a normal mood and affect.  Nursing note and vitals reviewed.   ED Course  Procedures (including critical care time) Labs Review  Results for orders placed or performed during the hospital encounter of 09/21/14  CBC with Differential  Result Value Ref Range   WBC 10.8 (H) 4.0 - 10.5 K/uL   RBC 4.95 3.87 - 5.11 MIL/uL   Hemoglobin 14.3 12.0 - 15.0 g/dL   HCT 16.1 09.6 - 04.5 %   MCV 86.7 78.0 - 100.0 fL   MCH 28.9 26.0 - 34.0 pg   MCHC 33.3 30.0 - 36.0 g/dL   RDW 40.9 81.1 - 91.4 %   Platelets 204 150 - 400 K/uL   Neutrophils Relative % 83 (H) 43 - 77 %   Neutro Abs 9.0 (H) 1.7 - 7.7 K/uL   Lymphocytes Relative 9 (L) 12 - 46 %   Lymphs Abs 0.9 0.7 - 4.0 K/uL   Monocytes Relative 7 3 - 12 %   Monocytes Absolute 0.8 0.1 - 1.0 K/uL   Eosinophils Relative 1 0 - 5 %   Eosinophils Absolute 0.1 0.0 - 0.7  K/uL   Basophils Relative 0 0 - 1 %   Basophils Absolute 0.0 0.0 - 0.1 K/uL  Comprehensive metabolic panel  Result Value Ref Range   Sodium 137 135 - 145 mmol/L   Potassium 4.4 3.5 - 5.1 mmol/L   Chloride 104 96 - 112 mmol/L   CO2 23 19 - 32 mmol/L   Glucose, Bld 106 (H) 70 - 99 mg/dL   BUN 10 6 - 23 mg/dL   Creatinine, Ser 7.82 0.50 - 1.10 mg/dL   Calcium 9.3 8.4 - 95.6 mg/dL   Total Protein 6.9 6.0 - 8.3 g/dL   Albumin 3.9 3.5 - 5.2 g/dL   AST 18 0 - 37 U/L   ALT 9 0 - 35 U/L   Alkaline Phosphatase 47 39 - 117 U/L   Total Bilirubin  0.5 0.3 - 1.2 mg/dL   GFR calc non Af Amer >90 >90 mL/min   GFR calc Af Amer >90 >90 mL/min   Anion gap 10 5 - 15  Lipase, blood  Result Value Ref Range   Lipase 20 11 - 59 U/L  Urinalysis, Routine w reflex microscopic  Result Value Ref Range   Color, Urine YELLOW YELLOW   APPearance CLEAR CLEAR   Specific Gravity, Urine 1.021 1.005 - 1.030   pH 6.0 5.0 - 8.0   Glucose, UA NEGATIVE NEGATIVE mg/dL   Hgb urine dipstick NEGATIVE NEGATIVE   Bilirubin Urine NEGATIVE NEGATIVE   Ketones, ur NEGATIVE NEGATIVE mg/dL   Protein, ur NEGATIVE NEGATIVE mg/dL   Urobilinogen, UA 0.2 0.0 - 1.0 mg/dL   Nitrite NEGATIVE NEGATIVE   Leukocytes, UA NEGATIVE NEGATIVE  POC Urine Pregnancy, ED  (If Pre-menopausal female) - do not order at [facility]  Result Value Ref Range   Preg Test, Ur NEGATIVE NEGATIVE   Ct Abdomen Pelvis W Contrast  09/21/2014   CLINICAL DATA:  Upper abdominal pain  EXAM: CT ABDOMEN AND PELVIS WITH CONTRAST  TECHNIQUE: Multidetector CT imaging of the abdomen and pelvis was performed using the standard protocol following bolus administration of intravenous contrast. Oral contrast was also administered.  CONTRAST:  OMNIPAQUE IOHEXOL 300 MG/ML  SOLN  COMPARISON:  February 19, 2012  FINDINGS: There is dependent atelectasis in the lung bases posteriorly. Lung bases are otherwise clear.  There is a 5 mm cyst in the medial segment of the left lobe of  the liver. No other focal liver lesions are identified. Gallbladder is absent. There is no appreciable biliary duct dilatation.  Spleen, pancreas, and adrenals appear normal. Kidneys bilaterally show no mass or hydronephrosis on either side. There is no renal or ureteral calculus on either side.  There is a ventriculoperitoneal shunt catheter with the catheter tip in the left lower quadrant. There is no fluid surrounding the catheter.  In the pelvis, the urinary bladder is midline with normal wall thickness. There is wall thickening in the distal sigmoid and rectal regions. There is no evidence of diverticulitis. There is no pelvic mass or pelvic fluid collection. A dominant follicle in the left ovary is noted measuring 2.0 x 1.6 cm, an incidental finding. Uterus is absent.  The appendix appears normal.  There is no bowel obstruction. No free air or portal venous air. There is a small ventral hernia containing only fat.  There is no ascites, adenopathy, or abscess in the abdomen or pelvis. There is no demonstrable abdominal aortic aneurysm. There is marked disc degeneration with vacuum phenomenon at L5-S1. There are no blastic or lytic bone lesions.  IMPRESSION: No mesenteric inflammation or bowel obstruction.  No abscess.  Wall thickening in the distal sigmoid colon and rectum. The appearance in these areas is consistent with distal colitis and proctitis. There is no surrounding mesenteric stranding or abscess. No diverticulitis.  Appendix appears normal.  No renal or ureteral calculus.  No hydronephrosis.  Ventriculoperitoneal shunt catheter without surrounding fluid or inflammation.  Uterus and gallbladder absent.   Electronically Signed   By: Bretta Bang III M.D.   On: 09/21/2014 18:28      MDM   Iv ns. Labs.   Pt requests pain and nausea medication.  Dilaudid 1 mg iv. zofran iv.  protonix iv.  Ct pending.  Reviewed nursing notes and prior charts for additional history.   Ct with  colitis/proctitis.  Hx diverticulitis. Mod  tenderness on exam.  Will tx w abx. cipro and flagyl.  Recheck pt tolerating po, appears comfortable, vitals normal.    Pt currently appears stable for d/c.  Discussed close pcp f/u/gi eval.       Cathren Laine, MD 09/21/14 [phone]

## 2014-09-21 NOTE — ED Notes (Signed)
Pt having abd pain, N,V. sts hx of same with diverticulitis. sts x 2 days.

## 2014-09-21 NOTE — ED Notes (Signed)
Dr. Steinl at bedside 

## 2014-09-21 NOTE — ED Notes (Signed)
Pt finished drinking ct contrast, notified CT

## 2014-09-21 NOTE — ED Provider Notes (Signed)
This chart was scribed for Trixie DredgeEmily Shirrell Solinger, PA-C with Layla MawKristen N Ward, DO by Tonye RoyaltyJoshua Chen, ED Scribe. This patient was seen in room TR03C/TR03C and the patient's care was started at 2:17 PM.   Patient seen in Pod F in attempt to order appropriate studies in order to expedite her care once she is seen in the Main ED.   Beth Kennedy is a 44 y.o. female who presents to the Emergency Department complaining of abdominal pain,  constipation with onset 3 days ago and vomiting with onset this morning. She states she is not passing any gas but reports burping. She reports pain to upper abdomen. She reports history of partial hysterectomy, cholecystectomy, ruptured fallopian tube and ectopic pregnancy, and diverticulitis. States this feels like prior diverticulitis. She denies fever but states she always feels cold. She denies cough or SOB.  Denies hx SBO.    Abdomen soft, nondistended, TTP epigastric/RUQ areas, no guarding, no rebound.   Patient agrees with treatment plan, including CT of abdomen. Other workup and evaluation deferred to provider in Main ED. Pt to be moved back to the waiting room to await room available in main ED.    I personally performed the services described in this documentation, which was scribed in my presence. The recorded information has been reviewed and is accurate.    Trixie Dredgemily Dabid [place], PA-C 09/21/14 1424  Layla MawKristen N Ward, DO 09/21/14 1549

## 2014-09-21 NOTE — ED Notes (Signed)
Pt ambulated to restroom. 

## 2014-09-21 NOTE — Discharge Instructions (Signed)
It was our pleasure to provide your ER care today - we hope that you feel better.  Rest. Drink plenty of fluids.  Take antibiotics (cipro and flagyl) as prescribed.  Do not drink any alcohol when taking flagyl.  You may take ultram as need for pain - no driving when taking.  Your ct scan was read as showing 'colitis, proctitis' - follow up with gi doctor in the next couple weeks - see referral - call office to arrange appointment.   Also follow up with primary care doctor closely - see referral.  Return to ER if worse, new symptoms, fevers, persistent vomiting, worsening or severe pain, other concern.  You were given pain medication in the ER - no driving for the next 4 hours.     Abdominal Pain Many things can cause abdominal pain. Usually, abdominal pain is not caused by a disease and will improve without treatment. It can often be observed and treated at home. Your health care provider will do a physical exam and possibly order blood tests and X-rays to help determine the seriousness of your pain. However, in many cases, more time must pass before a clear cause of the pain can be found. Before that point, your health care provider may not know if you need more testing or further treatment. HOME CARE INSTRUCTIONS  Monitor your abdominal pain for any changes. The following actions may help to alleviate any discomfort you are experiencing:  Only take over-the-counter or prescription medicines as directed by your health care provider.  Do not take laxatives unless directed to do so by your health care provider.  Try a clear liquid diet (broth, tea, or water) as directed by your health care provider. Slowly move to a bland diet as tolerated. SEEK MEDICAL CARE IF:  You have unexplained abdominal pain.  You have abdominal pain associated with nausea or diarrhea.  You have pain when you urinate or have a bowel movement.  You experience abdominal pain that wakes you in the  night.  You have abdominal pain that is worsened or improved by eating food.  You have abdominal pain that is worsened with eating fatty foods.  You have a fever. SEEK IMMEDIATE MEDICAL CARE IF:   Your pain does not go away within 2 hours.  You keep throwing up (vomiting).  Your pain is felt only in portions of the abdomen, such as the right side or the left lower portion of the abdomen.  You pass bloody or black tarry stools. MAKE SURE YOU:  Understand these instructions.   Will watch your condition.   Will get help right away if you are not doing well or get worse.  Document Released: 03/06/2005 Document Revised: 06/01/2013 Document Reviewed: 02/03/2013 [facility] Patient Information 2015 [place]. This information is not intended to replace advice given to you by your health care provider. Make sure you discuss any questions you have with your health care provider.    Colitis Colitis is inflammation of the colon. Colitis can be a short-term or long-standing (chronic) illness. Crohn's disease and ulcerative colitis are 2 types of colitis which are chronic. They usually require lifelong treatment. CAUSES  There are many different causes of colitis, including:  Viruses.  Germs (bacteria).  Medicine reactions. SYMPTOMS   Diarrhea.  Intestinal bleeding.  Pain.  Fever.  Throwing up (vomiting).  Tiredness (fatigue).  Weight loss.  Bowel blockage. DIAGNOSIS  The diagnosis of colitis is based on examination and stool or blood tests. X-rays,  CT scan, and colonoscopy may also be needed. TREATMENT  Treatment may include:  Fluids given through the vein (intravenously).  Bowel rest (nothing to eat or drink for a period of time).  Medicine for pain and diarrhea.  Medicines (antibiotics) that kill germs.  Cortisone medicines.  Surgery. HOME CARE INSTRUCTIONS   Get plenty of rest.  Drink enough water and fluids to keep your urine clear or pale  yellow.  Eat a well-balanced diet.  Call your caregiver for follow-up as recommended. SEEK IMMEDIATE MEDICAL CARE IF:   You develop chills.  You have an oral temperature above 102 F (38.9 C), not controlled by medicine.  You have extreme weakness, fainting, or dehydration.  You have repeated vomiting.  You develop severe belly (abdominal) pain or are passing bloody or tarry stools. MAKE SURE YOU:   Understand these instructions.  Will watch your condition.  Will get help right away if you are not doing well or get worse. Document Released: 07/04/2004 Document Revised: 08/19/2011 Document Reviewed: 09/29/2009 [facility] Patient Information 2015 [place], [place]. This information is not intended to replace advice given to you by your health care provider. Make sure you discuss any questions you have with your health care provider.

## 2014-09-22 NOTE — Progress Notes (Signed)
Telephone Note: ED CM [facility] [address] [facility] filling percocet  prescription last week, for 30 days. Pharmacist Donnie  states, he will not fill prescription. No further questions or concerns.

## 2015-01-02 ENCOUNTER — Emergency Department (HOSPITAL_COMMUNITY): Payer: Medicare Other

## 2015-01-02 ENCOUNTER — Emergency Department (HOSPITAL_COMMUNITY)
Admission: EM | Admit: 2015-01-02 | Discharge: 2015-01-02 | Disposition: A | Discharge status: Home / Self Care | Payer: Medicare Other | Attending: Emergency Medicine | Admitting: Emergency Medicine

## 2015-01-02 ENCOUNTER — Encounter (HOSPITAL_COMMUNITY): Payer: Self-pay | Admitting: Emergency Medicine

## 2015-01-02 DIAGNOSIS — R1030 Lower abdominal pain, unspecified: Secondary | ICD-10-CM | POA: Diagnosis present

## 2015-01-02 DIAGNOSIS — Z720 Tobacco use: Secondary | ICD-10-CM | POA: Insufficient documentation

## 2015-01-02 DIAGNOSIS — F329 Major depressive disorder, single episode, unspecified: Secondary | ICD-10-CM | POA: Insufficient documentation

## 2015-01-02 DIAGNOSIS — Z79899 Other long term (current) drug therapy: Secondary | ICD-10-CM | POA: Diagnosis not present

## 2015-01-02 DIAGNOSIS — G8929 Other chronic pain: Secondary | ICD-10-CM | POA: Diagnosis not present

## 2015-01-02 DIAGNOSIS — Z792 Long term (current) use of antibiotics: Secondary | ICD-10-CM | POA: Diagnosis not present

## 2015-01-02 DIAGNOSIS — Z91040 Latex allergy status: Secondary | ICD-10-CM | POA: Diagnosis not present

## 2015-01-02 DIAGNOSIS — K529 Noninfective gastroenteritis and colitis, unspecified: Secondary | ICD-10-CM | POA: Diagnosis not present

## 2015-01-02 LAB — COMPREHENSIVE METABOLIC PANEL
ALT: 11 U/L — ABNORMAL LOW (ref 14–54)
ANION GAP: 8 (ref 5–15)
AST: 23 U/L (ref 15–41)
Albumin: 4.6 g/dL (ref 3.5–5.0)
Alkaline Phosphatase: 56 U/L (ref 38–126)
BUN: 6 mg/dL (ref 6–20)
CALCIUM: 9.8 mg/dL (ref 8.9–10.3)
CO2: 27 mmol/L (ref 22–32)
CREATININE: 0.71 mg/dL (ref 0.44–1.00)
Chloride: 105 mmol/L (ref 101–111)
GFR calc non Af Amer: >60 mL/min (ref >60)
Glucose, Bld: 92 mg/dL (ref 65–99)
Potassium: 4.2 mmol/L (ref 3.5–5.1)
SODIUM: 140 mmol/L (ref 135–145)
TOTAL PROTEIN: 7.8 g/dL (ref 6.5–8.1)
Total Bilirubin: 0.9 mg/dL (ref 0.3–1.2)

## 2015-01-02 LAB — CBC
HEMATOCRIT: 46.2 % — AB (ref 36.0–46.0)
Hemoglobin: 15.4 g/dL — ABNORMAL HIGH (ref 12.0–15.0)
MCH: 29.3 pg (ref 26.0–34.0)
MCHC: 33.3 g/dL (ref 30.0–36.0)
MCV: 88 fL (ref 78.0–100.0)
Platelets: 240 10*3/uL (ref 150–400)
RBC: 5.25 MIL/uL — ABNORMAL HIGH (ref 3.87–5.11)
RDW: 13.5 % (ref 11.5–15.5)
WBC: 8.9 10*3/uL (ref 4.0–10.5)

## 2015-01-02 LAB — URINE MICROSCOPIC-ADD ON

## 2015-01-02 LAB — URINALYSIS, ROUTINE W REFLEX MICROSCOPIC
Glucose, UA: NEGATIVE mg/dL
Ketones, ur: 15 mg/dL — AB
Leukocytes, UA: NEGATIVE
Nitrite: NEGATIVE
PH: 6 (ref 5.0–8.0)
PROTEIN: NEGATIVE mg/dL
Specific Gravity, Urine: 1.024 (ref 1.005–1.030)
Urobilinogen, UA: 0.2 mg/dL (ref 0.0–1.0)

## 2015-01-02 LAB — LIPASE, BLOOD: LIPASE: 18 U/L — AB (ref 22–51)

## 2015-01-02 MED ORDER — METRONIDAZOLE 500 MG PO TABS: 500.0000 mg | ORAL_TABLET | Freq: Two times a day (BID) | ORAL | Status: DC

## 2015-01-02 MED ORDER — ONDANSETRON 4 MG PO TBDP
4.0000 mg | ORAL_TABLET | Freq: Once | ORAL | Status: AC | PRN
  Administered 2015-01-02: 4 mg via ORAL
  Filled 2015-01-02: qty 1

## 2015-01-02 MED ORDER — CIPROFLOXACIN HCL 500 MG PO TABS
500.0000 mg | ORAL_TABLET | Freq: Two times a day (BID) | ORAL | Status: DC
Start: 2015-01-02 — End: 2015-04-08

## 2015-01-02 MED ORDER — ONDANSETRON HCL 4 MG/2ML IJ SOLN
4.0000 mg | Freq: Once | INTRAMUSCULAR | Status: AC
  Administered 2015-01-02: 4 mg via INTRAVENOUS
  Filled 2015-01-02: qty 2

## 2015-01-02 MED ORDER — HYDROMORPHONE HCL 1 MG/ML IJ SOLN
1.0000 mg | Freq: Once | INTRAMUSCULAR | Status: AC
  Administered 2015-01-02: 1 mg via INTRAVENOUS
  Filled 2015-01-02: qty 1

## 2015-01-02 MED ORDER — IOHEXOL 300 MG/ML  SOLN
100.0000 mL | Freq: Once | INTRAMUSCULAR | Status: AC | PRN
  Administered 2015-01-02: 100 mL via INTRAVENOUS

## 2015-01-02 MED ORDER — IOHEXOL 300 MG/ML  SOLN
25.0000 mL | Freq: Once | INTRAMUSCULAR | Status: AC | PRN
  Administered 2015-01-02: 25 mL via ORAL

## 2015-01-02 MED ORDER — FLUCONAZOLE 150 MG PO TABS: 150.0000 mg | ORAL_TABLET | Freq: Once | ORAL | Status: DC

## 2015-01-02 NOTE — ED Provider Notes (Signed)
CSN: [id number]     Arrival date & time 01/02/15  1140 History   First MD Initiated Contact with Patient 01/02/15 1538     Chief Complaint  Patient presents with  . Abdominal Pain  . Nausea  . Emesis     (Consider location/radiation/quality/duration/timing/severity/associated sxs/prior Treatment) HPI   44 year old female with prior history of ectopic pregnancy, history of chronic back pain and depression who presents for evaluation of abd pain and vomiting. Patient states she has a history of diverticulitis. For the past week she has been having low abdominal pain felt similar to prior diverticulitis. States for the past 3 days she in on time eating because of her nausea. She can drink some fluid. She has vomit this morning. The first vomit has trace of blood. She feels that her abdomen is distended and complaining of stabbing and squeezing sensation in her left lower quadrant abdomen. Endorse having diarrhea for the past week with decreased appetite. Diarrhea is described as dark liquid. Endorse chills with past week. She has history of bleeding ulcers, and history of prior cholecystectomy. 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 Endorse having headache for the past 2 weeks is waxing waning and across the temporal similar to prior tension headache.     Past Medical History  Diagnosis Date  . Chronic back pain   . Ectopic pregnancy   . Depression    Past Surgical History  Procedure Laterality Date  . Ventriculoperitoneal shunt    . Laproscopy    . Cholecystectomy    . Back surgery    . Abdominal hysterectomy     No family history on file. History  Substance Use Topics  . Smoking status: Current Every Day Smoker -- 1.00 packs/day    Types: Cigarettes  . Smokeless tobacco: Not on file  . Alcohol Use: Yes     Comment: occasional   OB History    No data available     Review of Systems  All other systems reviewed and are  negative.     Allergies  Buprenorphine hcl; Morphine and related; Latex; and Percocet  Home Medications   Prior to Admission medications   Medication Sig Start Date End Date Taking? Authorizing Provider  cetirizine (ZYRTEC) 10 MG tablet Take 10 mg by mouth daily as needed for allergies.  07/21/14  Yes Historical Provider, MD  gabapentin (NEURONTIN) 100 MG capsule Take 100 mg by mouth 3 (three) times daily. 09/16/14  Yes Historical Provider, MD  HYDROcodone-acetaminophen (NORCO/VICODIN) 5-325 MG per tablet Take by mouth 4 (four) times daily as needed. For pain 09/16/14  Yes Historical Provider, MD  lamoTRIgine (LAMICTAL) 100 MG tablet Take 100 mg by mouth every morning.  08/13/12  Yes Historical Provider, MD  amoxicillin (AMOXIL) 500 MG capsule Take 1 capsule (500 mg total) by mouth 3 (three) times daily. 07/09/14   Cathren Laine, MD  cetirizine-pseudoephedrine (ZYRTEC-D) 5-120 MG per tablet Take 1 tablet by mouth 2 (two) times daily as needed for allergies. 07/09/14   Cathren Laine, MD  ciprofloxacin (CIPRO) 500 MG tablet Take 1 tablet (500 mg total) by mouth 2 (two) times daily. 09/21/14   Cathren Laine, MD  diazepam (VALIUM) 5 MG tablet Take 1 tablet (5 mg total) by mouth every 8 (eight) hours as needed for muscle spasms (pain). Patient not taking: Reported on 07/09/2014 12/07/13   Trixie Dredge, PA-C  metroNIDAZOLE (FLAGYL) 500 MG tablet Take 1 tablet (500 mg total) by mouth  4 (four) times daily. 09/21/14   Cathren Laine, MD  ondansetron (ZOFRAN ODT) 8 MG disintegrating tablet Take 1 tablet (8 mg total) by mouth every 8 (eight) hours as needed for nausea or vomiting. 09/21/14   Cathren Laine, MD  oxyCODONE-acetaminophen (PERCOCET/ROXICET) 5-325 MG per tablet Take 1-2 tablets by mouth every 4 (four) hours as needed for moderate pain or severe pain. Patient not taking: Reported on 07/09/2014 12/07/13   Trixie Dredge, PA-C  traMADol (ULTRAM) 50 MG tablet Take 1 tablet (50 mg total) by mouth every 6 (six) hours as  needed. 09/21/14   Cathren Laine, MD  zolpidem (AMBIEN) 10 MG tablet Take 10 mg by mouth at bedtime. 06/30/14   Historical Provider, MD   BP 132/99 mmHg  Pulse 59  Temp(Src) 98.3 F (36.8 C) (Oral)  Resp 18  Ht 5\' 8"  (1.727 m)  Wt 175 lb (79.379 kg)  BMI 26.61 kg/m2  SpO2 100% Physical Exam  Constitutional: She appears well-developed and well-nourished. No distress.  HENT:  Head: Atraumatic.  Eyes: Conjunctivae are normal.  Neck: Normal range of motion. Neck supple.  No nuchal rigidity.  Cardiovascular: Normal rate and regular rhythm.   Pulmonary/Chest: Effort normal and breath sounds normal. No respiratory distress. She exhibits no tenderness.  Abdominal: Soft. Bowel sounds are normal. She exhibits no distension. There is tenderness (Mild diffuse abdominal tenderness most significant to periumbilical region and suprapubic without guarding or rebound tenderness.).  Neurological: She is alert.  Skin: No rash noted.  Psychiatric: She has a normal mood and affect.  Nursing note and vitals reviewed.   ED Course  Procedures (including critical care time)  Patient here with low abdominal pain for nearly a week, felt similar to prior diverticulitis. She was treated for colitis a month ago with Cipro and Flagyl and states that it did improve some symptoms for approximately 3 weeks until the symptoms return. She will need further imaging today include abdominal pelvic CT scan. Pain medication given.  7:07 PM Abdominal pelvis CT scan shows no evidence of acute diverticulitis. The mural thickening of the sigmoid and rectum has resolved since 09/21/2014. There is minimal mucosal enhancement of the right hemicolon without transmural thickening or significant pericolonic inflammation changes. This could represent a degree of colitis which is mild. Since patient has similar complaint as previously when she was diagnosed with colitis, patient will be discharged with Cipro and Flagyl as treatment. She  also requests for Diflucan as she normally developed recent infection after antibiotic use. Patient has pain medication at home which she can take. She will follow-up with her PCP for further care. Return precautions discussed.  Labs Review Labs Reviewed  LIPASE, BLOOD - Abnormal; Notable for the following:    Lipase 18 (*)    All other components within normal limits  COMPREHENSIVE METABOLIC PANEL - Abnormal; Notable for the following:    ALT 11 (*)    All other components within normal limits  CBC - Abnormal; Notable for the following:    RBC 5.25 (*)    Hemoglobin 15.4 (*)    HCT 46.2 (*)    All other components within normal limits  URINALYSIS, ROUTINE W REFLEX MICROSCOPIC (NOT AT [facility]) - Abnormal; Notable for the following:    Color, Urine AMBER (*)    Hgb urine dipstick TRACE (*)    Bilirubin Urine SMALL (*)    Ketones, ur 15 (*)    All other components within normal limits  URINE MICROSCOPIC-ADD ON - Abnormal;  Notable for the following:    Squamous Epithelial / LPF FEW (*)    All other components within normal limits    Imaging Review Ct Abdomen Pelvis W Contrast  01/02/2015   CLINICAL DATA:  Nausea vomiting, hematochezia and hematemesis.  EXAM: CT ABDOMEN AND PELVIS WITH CONTRAST  TECHNIQUE: Multidetector CT imaging of the abdomen and pelvis was performed using the standard protocol following bolus administration of intravenous contrast.  CONTRAST:  OMNIPAQUE IOHEXOL 300 MG/ML  SOLN  COMPARISON:  09/21/2014  FINDINGS: Lower chest: Minimal linear opacities in the posterior bases due to scarring or atelectasis.  Hepatobiliary: There is an unchanged 5 mm hypodensity in the left hepatic lobe which likely represents a benign cyst, too small for definitive characterization. There is prior cholecystectomy. Otherwise normal appearances of the liver and bile ducts.  Pancreas: Normal  Spleen: Normal  Adrenals/Urinary Tract: The adrenals and kidneys are normal in appearance. There is  no urinary calculus evident. There is no hydronephrosis or ureteral dilatation. Collecting systems and ureters appear unremarkable.  Stomach/Bowel: The stomach and small bowel are normal in appearance. The appendix is normal. There is minimal mucosal enhancement in the right hemicolon which could represent a degree of colitis, but there is no transmural thickening or pericolonic inflammatory change. The sigmoid and rectal mural thickening observed on 09/21/2014 has resolved. No focal acute inflammatory changes are evident. There is no bowel obstruction. There is no extraluminal air.  Vascular/Lymphatic: The abdominal aorta is normal in caliber. There is no atherosclerotic calcification. There is no adenopathy in the abdomen or pelvis.  Reproductive: There is prior hysterectomy. No adnexal abnormalities are evident.  Other: There is a ventriculoperitoneal shunt with tip in the low midline. No abnormal fluid collections around the shunt.  Musculoskeletal: There is moderately severe degenerative disc change at L5-S1 with vacuum disc phenomenon. No significant musculoskeletal lesions. Incidentally noted small fat containing umbilical hernia.  IMPRESSION: 1. No evidence of acute diverticulitis. The mural thickening of the sigmoid and rectum has resolved since 09/21/2014. 2. Minimal mucosal enhancement of the right hemicolon without transmural thickening or significant pericolonic inflammatory change. This could represent a degree of colitis but overall the changes are very mild. 3. No obstruction, perforation or focal acute inflammatory changes. No ascites. 4. Unremarkable appearances of the ventriculoperitoneal shunt 5. Small fat containing umbilical hernia.   Electronically Signed   By: Ellery Plunk M.D.   On: 01/02/2015 18:38     EKG Interpretation None      MDM   Final diagnoses:  Colitis    BP 109/66 mmHg  Pulse 73  Temp(Src) 98.3 F (36.8 C) (Oral)  Resp 18  Ht  (1.727 m)  Wt 175 lb  (79.379 kg)  BMI 26.61 kg/m2  SpO2 100%  I have reviewed nursing notes and vital signs. I personally viewed the imaging tests through PACS system and agrees with radiologist's intepretation I reviewed available ER/hospitalization records through the EMR     Fayrene Helper, PA-C 01/02/15 1908  Purvis Sheffield, MD 01/03/15 0010

## 2015-01-02 NOTE — Discharge Instructions (Signed)

## 2015-01-02 NOTE — ED Notes (Signed)
Pt. Stated, It started when I had an infection in my colon, yesterday I started having nausea vomiting.  I had blood coming out both ends.  Im not sure if it was my ulcers or what

## 2015-02-22 ENCOUNTER — Encounter (HOSPITAL_COMMUNITY): Payer: Self-pay | Admitting: Neurology

## 2015-02-22 ENCOUNTER — Emergency Department (HOSPITAL_COMMUNITY)
Admission: EM | Admit: 2015-02-22 | Discharge: 2015-02-22 | Disposition: A | Discharge status: Home / Self Care | Payer: Medicare Other | Attending: Emergency Medicine | Admitting: Emergency Medicine

## 2015-02-22 DIAGNOSIS — G8929 Other chronic pain: Secondary | ICD-10-CM | POA: Insufficient documentation

## 2015-02-22 DIAGNOSIS — F329 Major depressive disorder, single episode, unspecified: Secondary | ICD-10-CM | POA: Diagnosis not present

## 2015-02-22 DIAGNOSIS — Y999 Unspecified external cause status: Secondary | ICD-10-CM | POA: Insufficient documentation

## 2015-02-22 DIAGNOSIS — T63301A Toxic effect of unspecified spider venom, accidental (unintentional), initial encounter: Secondary | ICD-10-CM

## 2015-02-22 DIAGNOSIS — W57XXXA Bitten or stung by nonvenomous insect and other nonvenomous arthropods, initial encounter: Secondary | ICD-10-CM | POA: Diagnosis not present

## 2015-02-22 DIAGNOSIS — Z720 Tobacco use: Secondary | ICD-10-CM | POA: Insufficient documentation

## 2015-02-22 DIAGNOSIS — S80861A Insect bite (nonvenomous), right lower leg, initial encounter: Secondary | ICD-10-CM | POA: Diagnosis not present

## 2015-02-22 DIAGNOSIS — Z79899 Other long term (current) drug therapy: Secondary | ICD-10-CM | POA: Insufficient documentation

## 2015-02-22 DIAGNOSIS — Y929 Unspecified place or not applicable: Secondary | ICD-10-CM | POA: Insufficient documentation

## 2015-02-22 DIAGNOSIS — Z91040 Latex allergy status: Secondary | ICD-10-CM | POA: Insufficient documentation

## 2015-02-22 DIAGNOSIS — Y939 Activity, unspecified: Secondary | ICD-10-CM | POA: Insufficient documentation

## 2015-02-22 MED ORDER — BACITRACIN ZINC 500 UNIT/GM EX OINT: 1.0000 "application " | TOPICAL_OINTMENT | Freq: Two times a day (BID) | CUTANEOUS | Status: DC

## 2015-02-22 MED ORDER — HYDROXYZINE HCL 25 MG PO TABS: 25.0000 mg | ORAL_TABLET | Freq: Four times a day (QID) | ORAL | Status: DC

## 2015-02-22 NOTE — ED Provider Notes (Signed)
CSN: [id number]     Arrival date & time 02/22/15  0941 History  This chart was scribed for non-physician practitioner, Marlon Pel, PA-C working with Jerelyn Scott, MD, by Jarvis Morgan, ED Scribe. This patient was seen in room TR05C/TR05C and the patient's care was started at 10:49 AM.     Chief Complaint  Patient presents with  . Insect Bite    The history is provided by the patient. No language interpreter was used.    HPI Comments: Beth Kennedy is a 44 y.o. female who presents to the Emergency Department complaining of multiple insect bites to posterior right lower leg onset 3 days. She reports associated redness and itching to the area, no pain. Pt applied hydrocortisone cream to the area (3x daily), Benadryl and rubbing alcohol with no relief. She states that scratching/touching the area makes it worse. She denies bites to any other area of her body. There is no draining from the bite areas. She denies any fever, chills, nausea or vomiting, arthralgias, myalgias, weakness, fatigue or headache..  Past Medical History  Diagnosis Date  . Chronic back pain   . Ectopic pregnancy   . Depression    Past Surgical History  Procedure Laterality Date  . Ventriculoperitoneal shunt    . Laproscopy    . Cholecystectomy    . Back surgery    . Abdominal hysterectomy     No family history on file. Social History  Substance Use Topics  . Smoking status: Current Every Day Smoker -- 1.00 packs/day    Types: Cigarettes  . Smokeless tobacco: None  . Alcohol Use: Yes     Comment: occasional   OB History    No data available     Review of Systems  Constitutional: Negative for fever and chills.  Gastrointestinal: Negative for nausea and vomiting.  Skin: Positive for color change and wound (multiple insect bites to right lower leg).  All other systems reviewed and are negative.   Allergies  Buprenorphine hcl; Morphine and related; Latex; and Percocet  Home Medications   Prior  to Admission medications   Medication Sig Start Date End Date Taking? Authorizing Provider  amoxicillin (AMOXIL) 500 MG capsule Take 1 capsule (500 mg total) by mouth 3 (three) times daily. 07/09/14   Cathren Laine, MD  bacitracin ointment Apply 1 application topically 2 (two) times daily. 02/22/15   Daleena Rotter Neva Seat, PA-C  cetirizine (ZYRTEC) 10 MG tablet Take 10 mg by mouth daily as needed for allergies.  07/21/14   Historical Provider, MD  cetirizine-pseudoephedrine (ZYRTEC-D) 5-120 MG per tablet Take 1 tablet by mouth 2 (two) times daily as needed for allergies. 07/09/14   Cathren Laine, MD  ciprofloxacin (CIPRO) 500 MG tablet Take 1 tablet (500 mg total) by mouth 2 (two) times daily. 01/02/15   Fayrene Helper, PA-C  diazepam (VALIUM) 5 MG tablet Take 1 tablet (5 mg total) by mouth every 8 (eight) hours as needed for muscle spasms (pain). Patient not taking: Reported on 07/09/2014 12/07/13   Trixie Dredge, PA-C  fluconazole (DIFLUCAN) 150 MG tablet Take 1 tablet (150 mg total) by mouth once. 01/02/15   Fayrene Helper, PA-C  gabapentin (NEURONTIN) 100 MG capsule Take 100 mg by mouth 3 (three) times daily. 09/16/14   Historical Provider, MD  HYDROcodone-acetaminophen (NORCO/VICODIN) 5-325 MG per tablet Take by mouth 4 (four) times daily as needed. For pain 09/16/14   Historical Provider, MD  hydrOXYzine (ATARAX/VISTARIL) 25 MG tablet Take 1 tablet (25 mg total) by  mouth every 6 (six) hours. 02/22/15   Philip Eckersley Neva Seat, PA-C  lamoTRIgine (LAMICTAL) 100 MG tablet Take 100 mg by mouth every morning.  08/13/12   Historical Provider, MD  metroNIDAZOLE (FLAGYL) 500 MG tablet Take 1 tablet (500 mg total) by mouth 2 (two) times daily. 01/02/15   Fayrene Helper, PA-C  ondansetron (ZOFRAN ODT) 8 MG disintegrating tablet Take 1 tablet (8 mg total) by mouth every 8 (eight) hours as needed for nausea or vomiting. 09/21/14   Cathren Laine, MD  oxyCODONE-acetaminophen (PERCOCET/ROXICET) 5-325 MG per tablet Take 1-2 tablets by mouth every 4 (four)  hours as needed for moderate pain or severe pain. Patient not taking: Reported on 07/09/2014 12/07/13   Trixie Dredge, PA-C  traMADol (ULTRAM) 50 MG tablet Take 1 tablet (50 mg total) by mouth every 6 (six) hours as needed. 09/21/14   Cathren Laine, MD  zolpidem (AMBIEN) 10 MG tablet Take 10 mg by mouth at bedtime. 06/30/14   Historical Provider, MD   Triage Vitals: BP 113/71 mmHg  Pulse 77  Temp(Src) 98.3 F (36.8 C) (Oral)  Resp 16  SpO2 100%  Physical Exam  Constitutional: She is oriented to person, place, and time. She appears well-developed and well-nourished. No distress.  HENT:  Head: Normocephalic and atraumatic.  Eyes: Conjunctivae and EOM are normal.  Neck: Neck supple. No tracheal deviation present.  Cardiovascular: Normal rate.   Pulmonary/Chest: Effort normal. No respiratory distress.  Musculoskeletal: Normal range of motion.  Neurological: She is alert and oriented to person, place, and time.  Skin: Skin is warm and dry.  Posterior right calf has multiple bruising insect bites, non tender, no induration or erythema. Skin is soft w/o fluctuance or firmness. Pedal pulse intact. No swelling to calf.  Psychiatric: She has a normal mood and affect. Her behavior is normal.  Nursing note and vitals reviewed.   ED Course  Procedures (including critical care time)  DIAGNOSTIC STUDIES: Oxygen Saturation is 100% on RA, normal by my interpretation.    COORDINATION OF CARE: 10:56 AM- recommended to apply ice to the area and to keep using hydrocortisone cream. Will prescribe Atarax for itching and Bacitracin ointment. Pt advised of plan for treatment and pt agrees. Pt does not appear ill and is not having systemic symptoms.   Labs Review Labs Reviewed - No data to display  Imaging Review No results found. I have personally reviewed and evaluated these images and lab results as part of my medical decision-making.   EKG Interpretation None      MDM   Final diagnoses:   Spider bite, accidental or unintentional, initial encounter    Medications - No data to display  44 y.o.Beth Kennedy's evaluation in the Emergency Department is complete. It has been determined that no acute conditions requiring further emergency intervention are present at this time. The patient/guardian have been advised of the diagnosis and plan. We have discussed signs and symptoms that warrant return to the ED, such as changes or worsening in symptoms.  Vital signs are stable at discharge. Filed Vitals:   02/22/15 0946  BP: 113/71  Pulse: 77  Temp: 98.3 F (36.8 C)  Resp: 16    Patient/guardian has voiced understanding and agreed to follow-up with the PCP or specialist.  I personally performed the services described in this documentation, which was scribed in my presence. The recorded information has been reviewed and is accurate.     Marlon Pel, PA-C 02/22/15 1103  Jerelyn Scott, MD 02/22/15 [id number]

## 2015-02-22 NOTE — ED Notes (Signed)
Pt reports insect bites to back of her right lower leg for 3 days. They are itchy. No pain

## 2015-02-22 NOTE — Discharge Instructions (Signed)
Insect Bite Mosquitoes, flies, fleas, bedbugs, and many other insects can bite. Insect bites are different from insect stings. A sting is when venom is injected into the skin. Some insect bites can transmit infectious diseases. SYMPTOMS  Insect bites usually turn red, swell, and itch for 2 to 4 days. They often go away on their own. TREATMENT  Your caregiver may prescribe antibiotic medicines if a bacterial infection develops in the bite. HOME CARE INSTRUCTIONS  Do not scratch the bite area.  Keep the bite area clean and dry. Wash the bite area thoroughly with soap and water.  Put ice or cool compresses on the bite area.  Put ice in a plastic bag.  Place a towel between your skin and the bag.  Leave the ice on for 20 minutes, 4 times a day for the first 2 to 3 days, or as directed.  You may apply a baking soda paste, cortisone cream, or calamine lotion to the bite area as directed by your caregiver. This can help reduce itching and swelling.  Only take over-the-counter or prescription medicines as directed by your caregiver.  If you are given antibiotics, take them as directed. Finish them even if you start to feel better. You may need a tetanus shot if:  You cannot remember when you had your last tetanus shot.  You have never had a tetanus shot.  The injury broke your skin. If you get a tetanus shot, your arm may swell, get red, and feel warm to the touch. This is common and not a problem. If you need a tetanus shot and you choose not to have one, there is a rare chance of getting tetanus. Sickness from tetanus can be serious. SEEK IMMEDIATE MEDICAL CARE IF:   You have increased pain, redness, or swelling in the bite area.  You see a red line on the skin coming from the bite.  You have a fever.  You have joint pain.  You have a headache or neck pain.  You have unusual weakness.  You have a rash.  You have chest pain or shortness of breath.  You have abdominal pain,  nausea, or vomiting.  You feel unusually tired or sleepy. MAKE SURE YOU:   Understand these instructions.  Will watch your condition.  Will get help right away if you are not doing well or get worse. Document Released: 07/04/2004 Document Revised: 08/19/2011 Document Reviewed: 12/26/2010 [facility] Patient Information 2015 [place], [place]. This information is not intended to replace advice given to you by your health care provider. Make sure you discuss any questions you have with your health care provider.  Spider Bite Most spider bites do not cause serious problems. HOME CARE  Do not scratch the bite.  Keep the bite clean and dry. Wash the bite with soap and water as told by your doctor.  Put ice on the bite.  Put ice in a plastic bag.  Place a towel between your skin and the bag.  Leave the ice on for 20 minutes. Do this 4 times a day for the first 2 to 3 days or as told by your doctor.  Raise (elevate) the bite above your heart.  Only take medicine as told by your doctor.  If you are given medicines (antibiotics), take them as told. Finish them even if you start to feel better. You may need a tetanus shot if:  You cannot remember when you had your last tetanus shot.  You have never had a tetanus  shot.  The bite broke your skin. If you need a tetanus shot and you choose not to have one, you may get tetanus. Sickness from tetanus can be serious. GET HELP RIGHT AWAY IF:  Your bite turns purple.  Your bite gets more puffy (swollen), painful, or red.  You are short of breath or have chest pain.  You have muscle cramps or painful muscle spasms.  You have belly (abdominal) pain.  You feel sick to your stomach (nauseous) or throw up (vomit).  You feel very tired or sleepy.  Your bite is not better after 3 days of treatment. MAKE SURE YOU:  Understand these instructions.  Will watch your condition.  Will get help right away if you are not doing well or get  worse. Document Released: 06/29/2010 Document Revised: 08/19/2011 Document Reviewed: 12/26/2010 [facility] Patient Information 2015 [place], [place]. This information is not intended to replace advice given to you by your health care provider. Make sure you discuss any questions you have with your health care provider.

## 2015-04-08 ENCOUNTER — Encounter (HOSPITAL_COMMUNITY): Payer: Self-pay | Admitting: Emergency Medicine

## 2015-04-08 ENCOUNTER — Emergency Department (HOSPITAL_COMMUNITY): Payer: Medicare Other

## 2015-04-08 ENCOUNTER — Emergency Department (HOSPITAL_COMMUNITY)
Admission: EM | Admit: 2015-04-08 | Discharge: 2015-04-08 | Disposition: A | Discharge status: Home / Self Care | Payer: Medicare Other | Attending: Emergency Medicine | Admitting: Emergency Medicine

## 2015-04-08 DIAGNOSIS — R6883 Chills (without fever): Secondary | ICD-10-CM | POA: Insufficient documentation

## 2015-04-08 DIAGNOSIS — G8929 Other chronic pain: Secondary | ICD-10-CM | POA: Diagnosis not present

## 2015-04-08 DIAGNOSIS — R109 Unspecified abdominal pain: Secondary | ICD-10-CM

## 2015-04-08 DIAGNOSIS — Z7951 Long term (current) use of inhaled steroids: Secondary | ICD-10-CM | POA: Diagnosis not present

## 2015-04-08 DIAGNOSIS — R1032 Left lower quadrant pain: Secondary | ICD-10-CM | POA: Diagnosis present

## 2015-04-08 DIAGNOSIS — A599 Trichomoniasis, unspecified: Secondary | ICD-10-CM | POA: Insufficient documentation

## 2015-04-08 DIAGNOSIS — Z91040 Latex allergy status: Secondary | ICD-10-CM | POA: Insufficient documentation

## 2015-04-08 DIAGNOSIS — F419 Anxiety disorder, unspecified: Secondary | ICD-10-CM | POA: Insufficient documentation

## 2015-04-08 DIAGNOSIS — Z720 Tobacco use: Secondary | ICD-10-CM | POA: Diagnosis not present

## 2015-04-08 DIAGNOSIS — Z79899 Other long term (current) drug therapy: Secondary | ICD-10-CM | POA: Diagnosis not present

## 2015-04-08 DIAGNOSIS — F329 Major depressive disorder, single episode, unspecified: Secondary | ICD-10-CM | POA: Insufficient documentation

## 2015-04-08 HISTORY — DX: Bipolar disorder, unspecified: F31.9

## 2015-04-08 LAB — CBC WITH DIFFERENTIAL/PLATELET
Basophils Absolute: 0 10*3/uL (ref 0.0–0.1)
Basophils Relative: 0 %
Eosinophils Absolute: 0.1 10*3/uL (ref 0.0–0.7)
Eosinophils Relative: 2 %
HEMATOCRIT: 44.3 % (ref 36.0–46.0)
HEMOGLOBIN: 14.4 g/dL (ref 12.0–15.0)
LYMPHS PCT: 21 %
Lymphs Abs: 1.5 10*3/uL (ref 0.7–4.0)
MCH: 28.8 pg (ref 26.0–34.0)
MCHC: 32.5 g/dL (ref 30.0–36.0)
MCV: 88.6 fL (ref 78.0–100.0)
MONOS PCT: 5 %
Monocytes Absolute: 0.4 10*3/uL (ref 0.1–1.0)
NEUTROS ABS: 5.2 10*3/uL (ref 1.7–7.7)
Neutrophils Relative %: 72 %
Platelets: 216 10*3/uL (ref 150–400)
RBC: 5 MIL/uL (ref 3.87–5.11)
RDW: 13 % (ref 11.5–15.5)
WBC: 7.2 10*3/uL (ref 4.0–10.5)

## 2015-04-08 LAB — COMPREHENSIVE METABOLIC PANEL
ALK PHOS: 57 U/L (ref 38–126)
ALT: 12 U/L — ABNORMAL LOW (ref 14–54)
ANION GAP: 10 (ref 5–15)
AST: 17 U/L (ref 15–41)
Albumin: 4 g/dL (ref 3.5–5.0)
BILIRUBIN TOTAL: 0.5 mg/dL (ref 0.3–1.2)
BUN: 11 mg/dL (ref 6–20)
CALCIUM: 9.6 mg/dL (ref 8.9–10.3)
CO2: 26 mmol/L (ref 22–32)
Chloride: 103 mmol/L (ref 101–111)
Creatinine, Ser: 0.79 mg/dL (ref 0.44–1.00)
GFR calc Af Amer: >60 mL/min (ref >60)
GFR calc non Af Amer: >60 mL/min (ref >60)
Glucose, Bld: 93 mg/dL (ref 65–99)
POTASSIUM: 4.2 mmol/L (ref 3.5–5.1)
Sodium: 139 mmol/L (ref 135–145)
TOTAL PROTEIN: 7.1 g/dL (ref 6.5–8.1)

## 2015-04-08 LAB — URINALYSIS, ROUTINE W REFLEX MICROSCOPIC
Bilirubin Urine: NEGATIVE
Glucose, UA: NEGATIVE mg/dL
Hgb urine dipstick: NEGATIVE
KETONES UR: NEGATIVE mg/dL
Nitrite: NEGATIVE
PH: 6 (ref 5.0–8.0)
Protein, ur: NEGATIVE mg/dL
SPECIFIC GRAVITY, URINE: 1.028 (ref 1.005–1.030)
Urobilinogen, UA: 0.2 mg/dL (ref 0.0–1.0)

## 2015-04-08 LAB — URINE MICROSCOPIC-ADD ON

## 2015-04-08 LAB — WET PREP, GENITAL: Yeast Wet Prep HPF POC: NONE SEEN

## 2015-04-08 LAB — LIPASE, BLOOD: LIPASE: 27 U/L (ref 11–51)

## 2015-04-08 MED ORDER — FLUCONAZOLE 100 MG PO TABS
150.0000 mg | ORAL_TABLET | Freq: Once | ORAL | Status: AC
  Administered 2015-04-08: 150 mg via ORAL
  Filled 2015-04-08: qty 2

## 2015-04-08 MED ORDER — METRONIDAZOLE 500 MG PO TABS: 500.0000 mg | ORAL_TABLET | Freq: Two times a day (BID) | ORAL | Status: DC

## 2015-04-08 MED ORDER — SODIUM CHLORIDE 0.9 % IV BOLUS (SEPSIS)
1000.0000 mL | Freq: Once | INTRAVENOUS | Status: AC
  Administered 2015-04-08: 1000 mL via INTRAVENOUS

## 2015-04-08 MED ORDER — DEXTROSE 5 % IV SOLN
1.0000 g | Freq: Once | INTRAVENOUS | Status: AC
  Administered 2015-04-08: 1 g via INTRAVENOUS
  Filled 2015-04-08: qty 10

## 2015-04-08 MED ORDER — KETOROLAC TROMETHAMINE 30 MG/ML IJ SOLN
30.0000 mg | Freq: Once | INTRAMUSCULAR | Status: AC
  Administered 2015-04-08: 30 mg via INTRAVENOUS
  Filled 2015-04-08: qty 1

## 2015-04-08 MED ORDER — AZITHROMYCIN 250 MG PO TABS
1000.0000 mg | ORAL_TABLET | Freq: Once | ORAL | Status: AC
  Administered 2015-04-08: 1000 mg via ORAL
  Filled 2015-04-08: qty 4

## 2015-04-08 NOTE — ED Notes (Signed)
Pt from home with c/o LLQ pain with urination, denies burning x 2 weeks.  Pt reports left flank/ lower back pain x 4 days with no hx of kidney stones.  Reports she has had multiple colon infections and recently treated herself for a yeast infection.  Pt in NAD, A&O.

## 2015-04-08 NOTE — ED Provider Notes (Signed)
CSN: [id number]     Arrival date & time 04/08/15  [id number] History   First MD Initiated Contact with Patient 04/08/15 0840     Chief Complaint  Patient presents with  . Flank Pain     (Consider location/radiation/quality/duration/timing/severity/associated sxs/prior Treatment) HPI Comments: 44 y.o. Female with history of chronic back pain, manic depression, reported diverticulitis presents for left flank pain.  The patient reports that recently she developed symptoms similar to when she has a yeast infection which she typically gets after using condoms as she has a latex allergy.  She tried treating herself for several days with a topical yeast infection medicine but has noted that she is having pain that seems to radiate from her left flank down to her bladder that is worse when she urinates.  She says she chronically has chills.  Denies fever.  She says the pain is also similar to when she has had diverticulitis in the past.  She was sexually active about 1 month ago and reports milky discharge that is odorless.  Patient is a 44 y.o. female presenting with flank pain.  Flank Pain Pertinent negatives include no abdominal pain, no headaches and no shortness of breath.    Past Medical History  Diagnosis Date  . Chronic back pain   . Ectopic pregnancy   . Depression   . Manic depression (HCC)    Past Surgical History  Procedure Laterality Date  . Ventriculoperitoneal shunt    . Laproscopy    . Cholecystectomy    . Back surgery    . Abdominal hysterectomy     History reviewed. No pertinent family history. Social History  Substance Use Topics  . Smoking status: Current Every Day Smoker -- 1.00 packs/day    Types: Cigarettes  . Smokeless tobacco: None  . Alcohol Use: Yes     Comment: occasional   OB History    No data available     Review of Systems  Constitutional: Positive for chills. Negative for fever and fatigue.  HENT: Negative for congestion, postnasal drip and  rhinorrhea.   Eyes: Negative for pain and redness.  Respiratory: Negative for cough, chest tightness and shortness of breath.   Gastrointestinal: Negative for nausea, vomiting, abdominal pain and diarrhea.  Genitourinary: Positive for dysuria and flank pain (left). Negative for urgency and hematuria.  Musculoskeletal: Negative for myalgias and back pain.  Skin: Negative for rash.  Neurological: Negative for dizziness, weakness, light-headedness and headaches.  Hematological: Does not bruise/bleed easily.      Allergies  Buprenorphine hcl; Morphine and related; Latex; and Percocet  Home Medications   Prior to Admission medications   Medication Sig Start Date End Date Taking? Authorizing Provider  gabapentin (NEURONTIN) 100 MG capsule Take 100 mg by mouth 3 (three) times daily. 09/16/14  Yes Historical Provider, MD  lamoTRIgine (LAMICTAL) 100 MG tablet Take 100 mg by mouth every morning.  08/13/12  Yes Historical Provider, MD  pantoprazole (PROTONIX) 40 MG tablet Take 40 mg by mouth daily.   Yes Historical Provider, MD  triamcinolone (NASACORT AQ) 55 MCG/ACT AERO nasal inhaler Place 2 sprays into the nose 2 (two) times daily.   Yes Historical Provider, MD  zolpidem (AMBIEN) 10 MG tablet Take 10 mg by mouth at bedtime as needed for sleep.  06/30/14  Yes Historical Provider, MD  metroNIDAZOLE (FLAGYL) 500 MG tablet Take 1 tablet (500 mg total) by mouth 2 (two) times daily. 04/08/15   Leta BaptistEmily Roe Chenoa Luddy, MD   BP 115/70  mmHg  Pulse 64  Temp(Src) 98 F (36.7 C) (Oral)  Resp 18  SpO2 100% Physical Exam  Constitutional: She is oriented to person, place, and time. She appears well-developed and well-nourished. No distress.  HENT:  Head: Normocephalic and atraumatic.  Right Ear: External ear normal.  Left Ear: External ear normal.  Nose: Nose normal.  Mouth/Throat: Oropharynx is clear and moist. No oropharyngeal exudate.  Eyes: EOM are normal. Pupils are equal, round, and reactive to light.   Neck: Normal range of motion. Neck supple.  Cardiovascular: Normal rate, regular rhythm, normal heart sounds and intact distal pulses.   No murmur heard. Pulmonary/Chest: Effort normal. No respiratory distress. She has no wheezes. She has no rales.  Abdominal: Soft. She exhibits no distension. There is no tenderness. There is no CVA tenderness.  Genitourinary: There is no rash or lesion on the right labia. There is no rash or lesion on the left labia. Cervix exhibits discharge. Cervix exhibits no motion tenderness and no friability. Right adnexum displays no mass, no tenderness and no fullness. Left adnexum displays no mass, no tenderness and no fullness. No erythema, tenderness or bleeding in the vagina. No foreign body around the vagina. No signs of injury around the vagina. Vaginal discharge (thick, whitish) found.  Musculoskeletal: Normal range of motion. She exhibits no edema or tenderness.  Lymphadenopathy:       Right: No inguinal adenopathy present.       Left: No inguinal adenopathy present.  Neurological: She is alert and oriented to person, place, and time.  Skin: Skin is warm and dry. No rash noted. She is not diaphoretic.  Vitals reviewed.   ED Course  Procedures (including critical care time) Labs Review Labs Reviewed  WET PREP, GENITAL - Abnormal; Notable for the following:    Trich, Wet Prep MODERATE (*)    Clue Cells Wet Prep HPF POC MODERATE (*)    WBC, Wet Prep HPF POC TOO NUMEROUS TO COUNT (*)    All other components within normal limits  URINALYSIS, ROUTINE W REFLEX MICROSCOPIC (NOT AT [facility]) - Abnormal; Notable for the following:    APPearance CLOUDY (*)    Leukocytes, UA SMALL (*)    All other components within normal limits  COMPREHENSIVE METABOLIC PANEL - Abnormal; Notable for the following:    ALT 12 (*)    All other components within normal limits  URINE MICROSCOPIC-ADD ON - Abnormal; Notable for the following:    Squamous Epithelial / LPF FEW (*)    All  other components within normal limits  CBC WITH DIFFERENTIAL/PLATELET  LIPASE, BLOOD  RPR  HIV ANTIBODY (ROUTINE TESTING)  GC/CHLAMYDIA PROBE AMP ([place]) NOT AT [facility]    Imaging Review Ct Renal Stone Study  04/08/2015  CLINICAL DATA:  Pt states she has had stomach pains "for a while" Also states "stop and go" urination, LLQ pain when urinating Left flank and back pain for a couple days Per pt- tp eval for kidney stones vs colon infection; colon infection x 3 EXAM: CT ABDOMEN AND PELVIS WITHOUT CONTRAST TECHNIQUE: Multidetector CT imaging of the abdomen and pelvis was performed following the standard protocol without IV contrast. COMPARISON:  01/02/2015 FINDINGS: Minimal dependent atelectasis in the visualized lung bases. Surgical clips in the gallbladder fossa. VP shunt tubing in the anterior peritoneal cavity without adjacent Fluid collection. Unremarkable liver, spleen, adrenal glands, pancreas, kidneys. Unenhanced CT was performed per clinician order. Lack of IV contrast limits sensitivity and specificity, especially for evaluation  of abdominal/pelvic solid viscera. Stomach, small bowel, and colon are nondilated. Normal appendix. Urinary bladder incompletely distended. Uterus and adnexal regions unremarkable. Bilateral pelvic phleboliths. No ascites. No free air. No adenopathy. Advanced degenerative disc disease L5-S1. IMPRESSION: 1. Negative for nephrolithiasis, ureteral calculus, or other acute abnormality. 2. Stable visualized portions of VP shunt tubing. Electronically Signed   By: Corlis Leak M.D.   On: 04/08/2015 10:01   I have personally reviewed and evaluated these images and lab results as part of my medical decision-making.   EKG Interpretation None      MDM  Patient seen and evaluated in stable condition.  Benign examination.  Patient extremely anxious.  CT negative for kidney stone or acute process.  UA not consistent with infection  Wet prep with clue cells and positive for  trich.  No CMT or adnexal tenderness and patient well appearing.  Other labs unremarkable.  Patient treated with Rocephin, Azithromycin, Fluconazole and discharged with prescription for flagyl.  All results and clinical impression discussed with patient who expressed understanding and agreement with plan.  She said she could follow up with her OBGYN.  All questions answered prior to discharge and patient discharged home in stable condition. Final diagnoses:  Left flank pain  Trichimoniasis    1. Trichomonas   2. Evaluation for STD  3. Dysuria    Leta Baptist, MD 04/09/15 0000

## 2015-04-08 NOTE — Discharge Instructions (Signed)
Trichomoniasis  You were seen today and found to have infection in your vagina.  Your were given medications to treat for STD here and need to take antibiotics for 1 week twice a day.  Follow up with your gynecologist for reevaluation if symptoms persist.  Take medications as directed.  Do not partake in sexual intercourse until antibiotics completed and do not have sex with any same partners until they are seen and treated.  Trichomoniasis is an infection caused by an organism called Trichomonas. The infection can affect both women and men. In women, the outer female genitalia and the vagina are affected. In men, the penis is mainly affected, but the prostate and other reproductive organs can also be involved. Trichomoniasis is a sexually transmitted infection (STI) and is most often passed to another person through sexual contact.  RISK FACTORS  Having unprotected sexual intercourse.  Having sexual intercourse with an infected partner. SIGNS AND SYMPTOMS  Symptoms of trichomoniasis in women include:  Abnormal gray-green frothy vaginal discharge.  Itching and irritation of the vagina.  Itching and irritation of the area outside the vagina. Symptoms of trichomoniasis in men include:   Penile discharge with or without pain.  Pain during urination. This results from inflammation of the urethra. DIAGNOSIS  Trichomoniasis may be found during a Pap test or physical exam. Your health care provider may use one of the following methods to help diagnose this infection:  Testing the pH of the vagina with a test tape.  Using a vaginal swab test that checks for the Trichomonas organism. A test is available that provides results within a few minutes.  Examining a urine sample.  Testing vaginal secretions. Your health care provider may test you for other STIs, including HIV. TREATMENT   You may be given medicine to fight the infection. Women should inform their health care provider if they  could be or are pregnant. Some medicines used to treat the infection should not be taken during pregnancy.  Your health care provider may recommend over-the-counter medicines or creams to decrease itching or irritation.  Your sexual partner will need to be treated if infected.  Your health care provider may test you for infection again 3 months after treatment. HOME CARE INSTRUCTIONS   Take medicines only as directed by your health care provider.  Take over-the-counter medicine for itching or irritation as directed by your health care provider.  Do not have sexual intercourse while you have the infection.  Women should not douche or wear tampons while they have the infection.  Discuss your infection with your partner. Your partner may have gotten the infection from you, or you may have gotten it from your partner.  Have your sex partner get examined and treated if necessary.  Practice safe, informed, and protected sex.  See your health care provider for other STI testing. SEEK MEDICAL CARE IF:   You still have symptoms after you finish your medicine.  You develop abdominal pain.  You have pain when you urinate.  You have bleeding after sexual intercourse.  You develop a rash.  Your medicine makes you sick or makes you throw up (vomit). MAKE SURE YOU:  Understand these instructions.  Will watch your condition.  Will get help right away if you are not doing well or get worse.   This information is not intended to replace advice given to you by your health care provider. Make sure you discuss any questions you have with your health care provider.  Document Released: 11/20/2000 Document Revised: 06/17/2014 Document Reviewed: 03/08/2013 Elsevier Interactive Patient Education [employer]2016 Elsevier Inc.

## 2015-04-09 LAB — RPR: RPR Ser Ql: NONREACTIVE

## 2015-04-09 LAB — HIV ANTIBODY (ROUTINE TESTING W REFLEX): HIV Screen 4th Generation wRfx: NONREACTIVE

## 2015-04-10 LAB — GC/CHLAMYDIA PROBE AMP (~~LOC~~) NOT AT ARMC
Chlamydia: NEGATIVE
Neisseria Gonorrhea: NEGATIVE

## 2015-05-24 ENCOUNTER — Emergency Department (HOSPITAL_COMMUNITY)
Admission: EM | Admit: 2015-05-24 | Discharge: 2015-05-24 | Disposition: A | Discharge status: Home / Self Care | Payer: Medicare Other | Attending: Emergency Medicine | Admitting: Emergency Medicine

## 2015-05-24 ENCOUNTER — Emergency Department (HOSPITAL_COMMUNITY): Payer: Medicare Other

## 2015-05-24 ENCOUNTER — Encounter (HOSPITAL_COMMUNITY): Payer: Self-pay | Admitting: Emergency Medicine

## 2015-05-24 DIAGNOSIS — F329 Major depressive disorder, single episode, unspecified: Secondary | ICD-10-CM | POA: Diagnosis not present

## 2015-05-24 DIAGNOSIS — R51 Headache: Secondary | ICD-10-CM | POA: Diagnosis present

## 2015-05-24 DIAGNOSIS — F17210 Nicotine dependence, cigarettes, uncomplicated: Secondary | ICD-10-CM | POA: Diagnosis not present

## 2015-05-24 DIAGNOSIS — R110 Nausea: Secondary | ICD-10-CM | POA: Diagnosis not present

## 2015-05-24 DIAGNOSIS — Z79899 Other long term (current) drug therapy: Secondary | ICD-10-CM | POA: Diagnosis not present

## 2015-05-24 DIAGNOSIS — Z7951 Long term (current) use of inhaled steroids: Secondary | ICD-10-CM | POA: Insufficient documentation

## 2015-05-24 DIAGNOSIS — G8929 Other chronic pain: Secondary | ICD-10-CM | POA: Insufficient documentation

## 2015-05-24 DIAGNOSIS — H538 Other visual disturbances: Secondary | ICD-10-CM | POA: Insufficient documentation

## 2015-05-24 DIAGNOSIS — Z91040 Latex allergy status: Secondary | ICD-10-CM | POA: Insufficient documentation

## 2015-05-24 DIAGNOSIS — R519 Headache, unspecified: Secondary | ICD-10-CM

## 2015-05-24 LAB — COMPREHENSIVE METABOLIC PANEL
ALK PHOS: 47 U/L (ref 38–126)
ALT: 18 U/L (ref 14–54)
AST: 40 U/L (ref 15–41)
Albumin: 4 g/dL (ref 3.5–5.0)
Anion gap: 9 (ref 5–15)
BUN: <5 mg/dL — ABNORMAL LOW (ref 6–20)
CALCIUM: 8.9 mg/dL (ref 8.9–10.3)
CO2: 23 mmol/L (ref 22–32)
CREATININE: 0.62 mg/dL (ref 0.44–1.00)
Chloride: 105 mmol/L (ref 101–111)
Glucose, Bld: 83 mg/dL (ref 65–99)
Potassium: 6.3 mmol/L (ref 3.5–5.1)
Sodium: 137 mmol/L (ref 135–145)
TOTAL PROTEIN: 6.7 g/dL (ref 6.5–8.1)
Total Bilirubin: 0.7 mg/dL (ref 0.3–1.2)

## 2015-05-24 LAB — CBC WITH DIFFERENTIAL/PLATELET
BASOS ABS: 0 10*3/uL (ref 0.0–0.1)
BASOS PCT: 0 %
EOS ABS: 0 10*3/uL (ref 0.0–0.7)
Eosinophils Relative: 0 %
HEMATOCRIT: 39.6 % (ref 36.0–46.0)
HEMOGLOBIN: 12.6 g/dL (ref 12.0–15.0)
Lymphocytes Relative: 10 %
Lymphs Abs: 0.7 10*3/uL (ref 0.7–4.0)
MCH: 28.2 pg (ref 26.0–34.0)
MCHC: 31.8 g/dL (ref 30.0–36.0)
MCV: 88.6 fL (ref 78.0–100.0)
Monocytes Absolute: 0.1 10*3/uL (ref 0.1–1.0)
Monocytes Relative: 2 %
NEUTROS ABS: 5.7 10*3/uL (ref 1.7–7.7)
NEUTROS PCT: 88 %
Platelets: 188 10*3/uL (ref 150–400)
RBC: 4.47 MIL/uL (ref 3.87–5.11)
RDW: 12.9 % (ref 11.5–15.5)
WBC: 6.5 10*3/uL (ref 4.0–10.5)

## 2015-05-24 LAB — POTASSIUM: Potassium: 4 mmol/L (ref 3.5–5.1)

## 2015-05-24 MED ORDER — PROMETHAZINE HCL 25 MG PO TABS: 25.0000 mg | ORAL_TABLET | Freq: Four times a day (QID) | ORAL | Status: DC | PRN

## 2015-05-24 MED ORDER — DEXAMETHASONE SODIUM PHOSPHATE 10 MG/ML IJ SOLN
10.0000 mg | Freq: Once | INTRAMUSCULAR | Status: AC
  Administered 2015-05-24: 10 mg via INTRAVENOUS
  Filled 2015-05-24: qty 1

## 2015-05-24 MED ORDER — MAGNESIUM SULFATE IN D5W 10-5 MG/ML-% IV SOLN
1.0000 g | Freq: Once | INTRAVENOUS | Status: DC
  Filled 2015-05-24: qty 100

## 2015-05-24 MED ORDER — PROMETHAZINE HCL 25 MG/ML IJ SOLN
25.0000 mg | Freq: Once | INTRAMUSCULAR | Status: AC
  Administered 2015-05-24: 25 mg via INTRAVENOUS
  Filled 2015-05-24: qty 1

## 2015-05-24 MED ORDER — VALPROATE SODIUM 500 MG/5ML IV SOLN
500.0000 mg | Freq: Once | INTRAVENOUS | Status: AC
  Administered 2015-05-24: 500 mg via INTRAVENOUS
  Filled 2015-05-24: qty 5

## 2015-05-24 MED ORDER — SODIUM CHLORIDE 0.9 % IV BOLUS (SEPSIS)
1000.0000 mL | Freq: Once | INTRAVENOUS | Status: AC
Start: 2015-05-24 — End: 2015-05-24
  Administered 2015-05-24: 1000 mL via INTRAVENOUS

## 2015-05-24 MED ORDER — METOCLOPRAMIDE HCL 5 MG/ML IJ SOLN
10.0000 mg | Freq: Once | INTRAMUSCULAR | Status: AC
  Administered 2015-05-24: 10 mg via INTRAVENOUS
  Filled 2015-05-24: qty 2

## 2015-05-24 MED ORDER — OXYMETAZOLINE HCL 0.05 % NA SOLN: 2.0000 | Freq: Once | NASAL | Status: DC

## 2015-05-24 MED ORDER — DIPHENHYDRAMINE HCL 50 MG/ML IJ SOLN
25.0000 mg | Freq: Once | INTRAMUSCULAR | Status: AC
  Administered 2015-05-24: 25 mg via INTRAVENOUS
  Filled 2015-05-24: qty 1

## 2015-05-24 NOTE — ED Provider Notes (Signed)
CSN: [id number]     Arrival date & time 05/24/15  1426 History   First MD Initiated Contact with Patient 05/24/15 1510     Chief Complaint  Patient presents with  . Headache   Patient is a 44 y.o. female presenting with headaches. The history is provided by the patient.  Headache Pain location:  R parietal, R temporal and occipital Quality:  Dull (Throbbing) Radiates to:  R neck Onset quality:  Gradual Duration:  3 days Timing:  Constant Progression:  Worsening Chronicity:  New Similar to prior headaches: yes   Relieved by:  Nothing Exacerbated by: Laying head down on pillow. Associated symptoms: blurred vision and nausea   Associated symptoms: no abdominal pain, no back pain, no dizziness, no fever, no neck pain, no photophobia, no sore throat and no vomiting   Associated symptoms comment:  Sinus pressure   Past Medical History  Diagnosis Date  . Chronic back pain   . Ectopic pregnancy   . Depression   . Manic depression (HCC)    Past Surgical History  Procedure Laterality Date  . Ventriculoperitoneal shunt    . Laproscopy    . Cholecystectomy    . Back surgery    . Abdominal hysterectomy    . Brain surgery     No family history on file. Social History  Substance Use Topics  . Smoking status: Current Every Day Smoker -- 1.00 packs/day    Types: Cigarettes  . Smokeless tobacco: None  . Alcohol Use: Yes     Comment: occasional   OB History    No data available     Review of Systems  Constitutional: Negative for fever.  HENT: Negative for rhinorrhea and sore throat.   Eyes: Positive for blurred vision. Negative for photophobia and visual disturbance.  Respiratory: Negative for chest tightness and shortness of breath.   Cardiovascular: Negative for chest pain and palpitations.  Gastrointestinal: Positive for nausea. Negative for vomiting, abdominal pain and constipation.  Genitourinary: Negative for dysuria and hematuria.  Musculoskeletal: Negative for back  pain and neck pain.  Skin: Negative for rash.  Neurological: Positive for headaches. Negative for dizziness.  Psychiatric/Behavioral: Negative for confusion.  All other systems reviewed and are negative.  Allergies  Buprenorphine hcl; Morphine and related; Latex; and Percocet  Home Medications   Prior to Admission medications   Medication Sig Start Date End Date Taking? Authorizing Provider  gabapentin (NEURONTIN) 100 MG capsule Take 100 mg by mouth 3 (three) times daily. 09/16/14  Yes Historical Provider, MD  HYDROcodone-acetaminophen (NORCO/VICODIN) 5-325 MG tablet Take 1 tablet by mouth every 6 (six) hours as needed for moderate pain.   Yes Historical Provider, MD  lamoTRIgine (LAMICTAL) 100 MG tablet Take 100 mg by mouth every morning.  08/13/12  Yes Historical Provider, MD  pantoprazole (PROTONIX) 40 MG tablet Take 40 mg by mouth daily.   Yes Historical Provider, MD  triamcinolone (NASACORT AQ) 55 MCG/ACT AERO nasal inhaler Place 2 sprays into the nose 2 (two) times daily.   Yes Historical Provider, MD  zolpidem (AMBIEN) 10 MG tablet Take 10 mg by mouth at bedtime as needed for sleep.  06/30/14  Yes Historical Provider, MD  promethazine (PHENERGAN) 25 MG tablet Take 1 tablet (25 mg total) by mouth every 6 (six) hours as needed for nausea or vomiting (or headache). 05/24/15   Maris Berger, MD   BP 106/85 mmHg  Pulse 83  Temp(Src) 98.6 F (37 C) (Oral)  Resp 16  SpO2  100% Physical Exam  Constitutional: She is oriented to person, place, and time. She appears well-developed and well-nourished. No distress.  HENT:  Head: Normocephalic and atraumatic.  Mouth/Throat: Oropharynx is clear and moist.  Eyes: EOM are normal. Pupils are equal, round, and reactive to light.  Neck: Neck supple. No JVD present.  Cardiovascular: Normal rate, regular rhythm, normal heart sounds and intact distal pulses.  Exam reveals no gallop.   No murmur heard. Pulmonary/Chest: Effort normal and breath sounds  normal. She has no wheezes. She has no rales.  Abdominal: Soft. She exhibits no distension. There is no tenderness.  Musculoskeletal: Normal range of motion. She exhibits no tenderness.  Neurological: She is alert and oriented to person, place, and time. She displays no tremor. No cranial nerve deficit. She exhibits normal muscle tone. Coordination normal. GCS eye subscore is 4. GCS verbal subscore is 5. GCS motor subscore is 6.  Reflex Scores:      Bicep reflexes are 3+ on the right side and 3+ on the left side.      Brachioradialis reflexes are 3+ on the right side and 3+ on the left side.      Patellar reflexes are 3+ on the right side and 3+ on the left side. Gait deferred. Decreased sensation left lower extremity  Skin: Skin is warm and dry. No rash noted.  Psychiatric: Her behavior is normal.    ED Course  Procedures  None   Labs Review Labs Reviewed  COMPREHENSIVE METABOLIC PANEL - Abnormal; Notable for the following:    Potassium 6.3 (*)    BUN <5 (*)    All other components within normal limits  CBC WITH DIFFERENTIAL/PLATELET  POTASSIUM  CBC WITH DIFFERENTIAL/PLATELET    Imaging Review Ct Head Wo Contrast  05/24/2015  CLINICAL DATA:  Headache for 3 days with nausea and anorexia, question VP shunt malfunction EXAM: CT HEAD WITHOUT CONTRAST TECHNIQUE: Contiguous axial images were obtained from the base of the skull through the vertex without intravenous contrast. COMPARISON:  07/09/2014 FINDINGS: Ventriculoperitoneal shunt via posterior RIGHT parietal approach with tip at LEFT lateral ventricle near foramina Monro, stable. Prior suboccipital craniotomy with decompression of foramen magnum. Low descent of cerebellar tonsils. Normal ventricular morphology, stable. No midline shift or mass effect. Otherwise normal appearance of brain parenchyma. No intracranial hemorrhage, mass lesion, or evidence acute infarction. No extra-axial fluid collections. Bones and sinuses otherwise  unremarkable. IMPRESSION: No acute intracranial abnormalities. Stable VP shunt without hydrocephalus or acute parenchymal brain abnormality. Electronically Signed   By: Ulyses Southward M.D.   On: 05/24/2015 17:04   I have personally reviewed and evaluated these images and lab results as part of my medical decision-making.  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 She states the pain feels like pressure and just reports right frontal sinus pain. She has tenderness to palpation of the frontal sinuses without any other sinus tenderness. She has residual left lower extremity numbness and tingling from prior neurosurgical procedures. Neurologic exam otherwise unremarkable. She is afebrile with no meningismus. Doubt meningitis. I am concern for shunt malfunction. We'll obtain head CT, labs and headache cocktail.  CT negative for acute process. Does not appear to be a shunt malfunction. Patient's pain did not improve with first cocktail. Patient given  Depacon, magnesium, Phenergan. Pain improved moderately after this. CT does also did not show evidence of acute sinusitis. Patient requesting antibiotics. I explained to her that she has no infectious process at this time. Patient states that she would like to see her neurologist, and I agreed that this would be appropriate for her. We'll discharge with short prescription for Phenergan and have her follow-up with her neurologist next week.  Discussed with Dr. Clayborne DanaMesner.   Maris BergerJonah Ludell Zacarias, MD 05/24/15 [id number]  Marily MemosJason Mesner, MD 05/24/15 [phone]

## 2015-05-24 NOTE — ED Provider Notes (Signed)
I saw and evaluated the patient, reviewed the resident's note and I agree with the findings and plan.  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 Has not had that adjusted at all recently. Is followed by neurosurgeon recently. She has had 3 days of headache similar to previous headaches of severe nature. Worse with light. Worsened when lays her head on the pillow. No neck pain or rigidity. No fevers, vomiting. On exam pupils are equal round reactive to light rest of cranial nerves are also within normal limits. No obvious sensation changes. Vitals are normal. Seems to be consistent with one of her migraines however with a shunt we will do a CT scan to evaluate. Also give headache cocktail in the meantime and reevaluate for appropriateness of discharge.   Marily MemosJason Meril Dray, MD 05/24/15 [phone]

## 2015-05-24 NOTE — ED Notes (Signed)
Per [facility] patient complains of headache that started 3 days ago.  Patient has been self-medicating using tylenol sinus and hydrocodone.  Patient has VP shunt due to brain malformation.  States she can't lay her head against anything because it makes the pain worse.

## 2015-05-24 NOTE — Discharge Instructions (Signed)

## 2015-07-21 ENCOUNTER — Emergency Department (HOSPITAL_COMMUNITY)
Admission: EM | Admit: 2015-07-21 | Discharge: 2015-07-21 | Disposition: A | Discharge status: Home / Self Care | Payer: Medicare Other | Attending: Emergency Medicine | Admitting: Emergency Medicine

## 2015-07-21 ENCOUNTER — Emergency Department (HOSPITAL_COMMUNITY): Payer: Medicare Other

## 2015-07-21 ENCOUNTER — Encounter (HOSPITAL_COMMUNITY): Payer: Self-pay

## 2015-07-21 DIAGNOSIS — F329 Major depressive disorder, single episode, unspecified: Secondary | ICD-10-CM | POA: Insufficient documentation

## 2015-07-21 DIAGNOSIS — R109 Unspecified abdominal pain: Secondary | ICD-10-CM | POA: Diagnosis not present

## 2015-07-21 DIAGNOSIS — G8929 Other chronic pain: Secondary | ICD-10-CM | POA: Insufficient documentation

## 2015-07-21 DIAGNOSIS — Z3202 Encounter for pregnancy test, result negative: Secondary | ICD-10-CM | POA: Insufficient documentation

## 2015-07-21 DIAGNOSIS — Z91040 Latex allergy status: Secondary | ICD-10-CM | POA: Diagnosis not present

## 2015-07-21 DIAGNOSIS — R110 Nausea: Secondary | ICD-10-CM | POA: Diagnosis present

## 2015-07-21 DIAGNOSIS — Z79899 Other long term (current) drug therapy: Secondary | ICD-10-CM | POA: Diagnosis not present

## 2015-07-21 DIAGNOSIS — F17210 Nicotine dependence, cigarettes, uncomplicated: Secondary | ICD-10-CM | POA: Diagnosis not present

## 2015-07-21 DIAGNOSIS — N760 Acute vaginitis: Secondary | ICD-10-CM | POA: Diagnosis not present

## 2015-07-21 DIAGNOSIS — R1033 Periumbilical pain: Secondary | ICD-10-CM

## 2015-07-21 DIAGNOSIS — B9689 Other specified bacterial agents as the cause of diseases classified elsewhere: Secondary | ICD-10-CM

## 2015-07-21 LAB — CBC WITH DIFFERENTIAL/PLATELET
BASOS PCT: 0 %
Basophils Absolute: 0 10*3/uL (ref 0.0–0.1)
Eosinophils Absolute: 0 10*3/uL (ref 0.0–0.7)
Eosinophils Relative: 1 %
HEMATOCRIT: 46 % (ref 36.0–46.0)
HEMOGLOBIN: 14.8 g/dL (ref 12.0–15.0)
LYMPHS ABS: 2 10*3/uL (ref 0.7–4.0)
LYMPHS PCT: 27 %
MCH: 28.2 pg (ref 26.0–34.0)
MCHC: 32.2 g/dL (ref 30.0–36.0)
MCV: 87.6 fL (ref 78.0–100.0)
MONO ABS: 0.4 10*3/uL (ref 0.1–1.0)
MONOS PCT: 5 %
NEUTROS ABS: 5 10*3/uL (ref 1.7–7.7)
NEUTROS PCT: 67 %
Platelets: 225 10*3/uL (ref 150–400)
RBC: 5.25 MIL/uL — ABNORMAL HIGH (ref 3.87–5.11)
RDW: 12.8 % (ref 11.5–15.5)
WBC: 7.4 10*3/uL (ref 4.0–10.5)

## 2015-07-21 LAB — COMPREHENSIVE METABOLIC PANEL
ALBUMIN: 4.3 g/dL (ref 3.5–5.0)
ALT: 13 U/L — ABNORMAL LOW (ref 14–54)
ANION GAP: 10 (ref 5–15)
AST: 19 U/L (ref 15–41)
Alkaline Phosphatase: 52 U/L (ref 38–126)
BUN: 9 mg/dL (ref 6–20)
CHLORIDE: 102 mmol/L (ref 101–111)
CO2: 27 mmol/L (ref 22–32)
Calcium: 9.6 mg/dL (ref 8.9–10.3)
Creatinine, Ser: 0.75 mg/dL (ref 0.44–1.00)
GFR calc Af Amer: >60 mL/min (ref >60)
GFR calc non Af Amer: >60 mL/min (ref >60)
GLUCOSE: 99 mg/dL (ref 65–99)
POTASSIUM: 4.4 mmol/L (ref 3.5–5.1)
SODIUM: 139 mmol/L (ref 135–145)
Total Bilirubin: 0.8 mg/dL (ref 0.3–1.2)
Total Protein: 7.3 g/dL (ref 6.5–8.1)

## 2015-07-21 LAB — URINE MICROSCOPIC-ADD ON

## 2015-07-21 LAB — WET PREP, GENITAL
SPERM: NONE SEEN
Trich, Wet Prep: NONE SEEN
Yeast Wet Prep HPF POC: NONE SEEN

## 2015-07-21 LAB — URINALYSIS, ROUTINE W REFLEX MICROSCOPIC
BILIRUBIN URINE: NEGATIVE
GLUCOSE, UA: NEGATIVE mg/dL
Hgb urine dipstick: NEGATIVE
Ketones, ur: NEGATIVE mg/dL
NITRITE: NEGATIVE
PH: 7 (ref 5.0–8.0)
Protein, ur: NEGATIVE mg/dL
SPECIFIC GRAVITY, URINE: 1.027 (ref 1.005–1.030)

## 2015-07-21 LAB — I-STAT BETA HCG BLOOD, ED (MC, WL, AP ONLY): I-stat hCG, quantitative: <5 m[IU]/mL (ref <5)

## 2015-07-21 LAB — LIPASE, BLOOD: Lipase: 26 U/L (ref 11–51)

## 2015-07-21 MED ORDER — TRAMADOL HCL 50 MG PO TABS: 50.0000 mg | ORAL_TABLET | Freq: Four times a day (QID) | ORAL | Status: DC | PRN

## 2015-07-21 MED ORDER — SODIUM CHLORIDE 0.9 % IV BOLUS (SEPSIS)
500.0000 mL | Freq: Once | INTRAVENOUS | Status: AC
  Administered 2015-07-21: 500 mL via INTRAVENOUS

## 2015-07-21 MED ORDER — MECLIZINE HCL 25 MG PO TABS
25.0000 mg | ORAL_TABLET | Freq: Once | ORAL | Status: AC
  Administered 2015-07-21: 25 mg via ORAL
  Filled 2015-07-21: qty 1

## 2015-07-21 MED ORDER — FENTANYL CITRATE (PF) 100 MCG/2ML IJ SOLN
50.0000 ug | Freq: Once | INTRAMUSCULAR | Status: AC
  Administered 2015-07-21: 50 ug via INTRAVENOUS
  Filled 2015-07-21: qty 2

## 2015-07-21 MED ORDER — FLUCONAZOLE 150 MG PO TABS: 150.0000 mg | ORAL_TABLET | Freq: Once | ORAL | Status: DC

## 2015-07-21 MED ORDER — TRAMADOL HCL 50 MG PO TABS
50.0000 mg | ORAL_TABLET | Freq: Once | ORAL | Status: AC
  Administered 2015-07-21: 50 mg via ORAL
  Filled 2015-07-21: qty 1

## 2015-07-21 MED ORDER — IOHEXOL 300 MG/ML  SOLN
100.0000 mL | Freq: Once | INTRAMUSCULAR | Status: AC | PRN
  Administered 2015-07-21: 100 mL via INTRAVENOUS

## 2015-07-21 MED ORDER — ONDANSETRON 4 MG PO TBDP
8.0000 mg | ORAL_TABLET | Freq: Once | ORAL | Status: DC
  Filled 2015-07-21: qty 2

## 2015-07-21 MED ORDER — METRONIDAZOLE 500 MG PO TABS: 500.0000 mg | ORAL_TABLET | Freq: Two times a day (BID) | ORAL | Status: DC

## 2015-07-21 NOTE — Discharge Instructions (Signed)
Abdominal Pain, Adult Many things can cause belly (abdominal) pain. Most times, the belly pain is not dangerous. Many cases of belly pain can be watched and treated at home. HOME CARE   Do not take medicines that help you go poop (laxatives) unless told to by your doctor.  Only take medicine as told by your doctor.  Eat or drink as told by your doctor. Your doctor will tell you if you should be on a special diet. GET HELP IF:  You do not know what is causing your belly pain.  You have belly pain while you are sick to your stomach (nauseous) or have runny poop (diarrhea).  You have pain while you pee or poop.  Your belly pain wakes you up at night.  You have belly pain that gets worse or better when you eat.  You have belly pain that gets worse when you eat fatty foods.  You have a fever. GET HELP RIGHT AWAY IF:   The pain does not go away within 2 hours.  You keep throwing up (vomiting).  The pain changes and is only in the right or left part of the belly.  You have bloody or tarry looking poop. MAKE SURE YOU:   Understand these instructions.  Will watch your condition.  Will get help right away if you are not doing well or get worse.   This information is not intended to replace advice given to you by your health care provider. Make sure you discuss any questions you have with your health care provider.   Document Released: 11/13/2007 Document Revised: 06/17/2014 Document Reviewed: 02/03/2013 Elsevier Interactive Patient Education 2016 Elsevier Inc.  Bacterial Vaginosis Bacterial vaginosis is an infection of the vagina. It happens when too many germs (bacteria) grow in the vagina. Having this infection puts you at risk for getting other infections from sex. Treating this infection can help lower your risk for other infections, such as:   Chlamydia.  Gonorrhea.  HIV.  Herpes. HOME CARE  Take your medicine as told by your doctor.  Finish your medicine even  if you start to feel better.  Tell your sex partner that you have an infection. They should see their doctor for treatment.  During treatment:  Avoid sex or use condoms correctly.  Do not douche.  Do not drink alcohol unless your doctor tells you it is ok.  Do not breastfeed unless your doctor tells you it is ok. GET HELP IF:  You are not getting better after 3 days of treatment.  You have more grey fluid (discharge) coming from your vagina than before.  You have more pain than before.  You have a fever. MAKE SURE YOU:   Understand these instructions.  Will watch your condition.  Will get help right away if you are not doing well or get worse.   This information is not intended to replace advice given to you by your health care provider. Make sure you discuss any questions you have with your health care provider.   Document Released: 03/05/2008 Document Revised: 06/17/2014 Document Reviewed: 01/06/2013 Elsevier Interactive Patient Education [employer]

## 2015-07-21 NOTE — ED Provider Notes (Signed)
Report received at the beginning of shift.  Pt has 1 week hx of abd pain, started at the umbilicus.  Currently awaits abd/pelvis CT scan.    Hx of diverticulitis.  If neg, d/c with flagyl/tramadol  4:20 PM Prep shows evidence of clue cells and moderate WBC. She will be treated for vaginosis with Flagyl. Her labs are otherwise reassuring. An abdominal and pelvis CT scan showing no acute finding. A minimal ventral hernia containing only fat. Patient will be discharge with Flagyl and tramadol as previously discussed with the provider who has evaluated patient. Recommend patient to follow-up with her PCP for further care.  Results for orders placed or performed during the hospital encounter of 07/21/15  Wet prep, genital  Result Value Ref Range   Yeast Wet Prep HPF POC NONE SEEN NONE SEEN   Trich, Wet Prep NONE SEEN NONE SEEN   Clue Cells Wet Prep HPF POC PRESENT (A) NONE SEEN   WBC, Wet Prep HPF POC MODERATE (A) NONE SEEN   Sperm NONE SEEN   Comprehensive metabolic panel  Result Value Ref Range   Sodium 139 135 - 145 mmol/L   Potassium 4.4 3.5 - 5.1 mmol/L   Chloride 102 101 - 111 mmol/L   CO2 27 22 - 32 mmol/L   Glucose, Bld 99 65 - 99 mg/dL   BUN 9 6 - 20 mg/dL   Creatinine, Ser 7.25 0.44 - 1.00 mg/dL   Calcium 9.6 8.9 - 36.6 mg/dL   Total Protein 7.3 6.5 - 8.1 g/dL   Albumin 4.3 3.5 - 5.0 g/dL   AST 19 15 - 41 U/L   ALT 13 (L) 14 - 54 U/L   Alkaline Phosphatase 52 38 - 126 U/L   Total Bilirubin 0.8 0.3 - 1.2 mg/dL   GFR calc non Af Amer >60 >60 mL/min   GFR calc Af Amer >60 >60 mL/min   Anion gap 10 5 - 15  Lipase, blood  Result Value Ref Range   Lipase 26 11 - 51 U/L  CBC with Differential  Result Value Ref Range   WBC 7.4 4.0 - 10.5 K/uL   RBC 5.25 (H) 3.87 - 5.11 MIL/uL   Hemoglobin 14.8 12.0 - 15.0 g/dL   HCT 44.0 34.7 - 42.5 %   MCV 87.6 78.0 - 100.0 fL   MCH 28.2 26.0 - 34.0 pg   MCHC 32.2 30.0 - 36.0 g/dL   RDW 95.6 38.7 - 56.4 %   Platelets 225 150 - 400 K/uL    Neutrophils Relative % 67 %   Neutro Abs 5.0 1.7 - 7.7 K/uL   Lymphocytes Relative 27 %   Lymphs Abs 2.0 0.7 - 4.0 K/uL   Monocytes Relative 5 %   Monocytes Absolute 0.4 0.1 - 1.0 K/uL   Eosinophils Relative 1 %   Eosinophils Absolute 0.0 0.0 - 0.7 K/uL   Basophils Relative 0 %   Basophils Absolute 0.0 0.0 - 0.1 K/uL  Urinalysis, Routine w reflex microscopic  Result Value Ref Range   Color, Urine YELLOW YELLOW   APPearance CLOUDY (A) CLEAR   Specific Gravity, Urine 1.027 1.005 - 1.030   pH 7.0 5.0 - 8.0   Glucose, UA NEGATIVE NEGATIVE mg/dL   Hgb urine dipstick NEGATIVE NEGATIVE   Bilirubin Urine NEGATIVE NEGATIVE   Ketones, ur NEGATIVE NEGATIVE mg/dL   Protein, ur NEGATIVE NEGATIVE mg/dL   Nitrite NEGATIVE NEGATIVE   Leukocytes, UA TRACE (A) NEGATIVE  Urine microscopic-add on  Result Value  Ref Range   Squamous Epithelial / LPF 6-30 (A) NONE SEEN   WBC, UA 0-5 0 - 5 WBC/hpf   RBC / HPF 0-5 0 - 5 RBC/hpf   Bacteria, UA FEW (A) NONE SEEN  I-Stat beta hCG blood, ED  Result Value Ref Range   I-stat hCG, quantitative <5.0 <5 mIU/mL   Comment 3           Ct Abdomen Pelvis W Contrast  07/21/2015  CLINICAL DATA:  One month history of lower abdominal pain with intermittent nausea and vomiting EXAM: CT ABDOMEN AND PELVIS WITH CONTRAST TECHNIQUE: Multidetector CT imaging of the abdomen and pelvis was performed using the standard protocol following bolus administration of intravenous contrast. CONTRAST:  OMNIPAQUE IOHEXOL 300 MG/ML  SOLN COMPARISON:  April 08, 2015 FINDINGS: Lower chest: There is posterior bibasilar atelectasis. There is no lung base edema or consolidation. Hepatobiliary: There is a tiny cyst in the medial segment of the left lobe of the liver. There is a probable flash filled hemangioma in the anterior segment of the right lobe of the liver measuring 1.0 x 1.0 cm. No other focal liver lesions are identified. There is hepatic steatosis. Gallbladder is absent. There is  no appreciable biliary duct dilatation. Pancreas: There is no pancreatic mass or inflammatory focus. Spleen: No splenic lesions are identified. Adrenals/Urinary Tract: Adrenals appear normal bilaterally. There is no renal mass or hydronephrosis apparent on either side. There is no renal or ureteral calculus on either side. Urinary bladder is midline with wall thickness within normal limits. Stomach/Bowel: There is no bowel wall or mesenteric thickening. There is no bowel obstruction. No free air or portal venous air. Vascular/Lymphatic: There is no abdominal aortic aneurysm. The major mesenteric vessels appear patent. There is no demonstrable adenopathy in the abdomen or pelvis. Reproductive: Uterus is absent. There is no pelvic mass or pelvic fluid collection. Other: There is a ventriculoperitoneal shunt catheter with the tip in the left mid pelvis. Appendix appears normal. There is no demonstrable abscess or ascites in the abdomen or pelvis. There is a minimal ventral hernia containing only fat. Musculoskeletal: There is marked disc space narrowing at L5-S1 with vacuum phenomenon at this level. Minimal retrolisthesis of L5 on S1 is felt to be due to underlying spondylosis. There are no blastic or lytic bone lesions. There is no intramuscular or abdominal wall lesion. IMPRESSION: A cause for patient's symptoms has not been established with this study. There is a ventriculoperitoneal shunt catheter with tip in mid left pelvis. 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 No bowel obstruction. No bowel wall or mesenteric thickening. No abscess. Appendix region appears normal. No renal or ureteral calculus.  No hydronephrosis. There is a minimal ventral hernia containing only fat. Uterus and gallbladder absent. Electronically Signed   By: Bretta Bang III M.D.   On: 07/21/2015 16:02      Fayrene Helper, PA-C 07/21/15 1627  Benjiman Core, MD 07/22/15 [phone]

## 2015-07-21 NOTE — ED Provider Notes (Signed)
CSN: [id number]     Arrival date & time 07/21/15  0920 History   First MD Initiated Contact with Patient 07/21/15 [phone]     Chief Complaint  Patient presents with  . Nausea     (Consider location/radiation/quality/duration/timing/severity/associated sxs/prior Treatment) HPI   Beth Kennedy This 45 year old female who presents emergency Department with chief complaint of. Umbilical abdominal pain. She is "going on for several days. Prior to her pain. She has had intermittent nausea, fatigue and decreased appetite. She has a history of manic depression, ventriculoperitoneal shunt with history of meningitis, previous back surgeries. She is a current daily smoker. Patient is very tearful during the examination. Patient states that she has pain around her umbilical region. And the lower half of her abdomen. She complains of constant nausea. She was given Phenergan previously, but was unable to take care of her for adopted grandchildren because it is making her so sleepy. She has not been taking that medication and has been suffering with the nausea. She states that it yesterday. She urinated and noticed blood when wiping. She is concerned that she may have a retained piece of condom from sexual intercourse a couple weeks ago causing her pain. She is also concerned that she may have infection in her intestines and has a history of diverticulitis. She denies vomiting, bloody stools, dyspareunia, or vaginal discharge.  She denies back pain other than her chronic lower back pain. Patient states that she is extremely worried because she has had multiple significant medical problems and is now taking care of her 4 grandchildren and states that she needs to be healthy to be able to take care of them. Partial hysterectomy.  Past Medical History  Diagnosis Date  . Chronic back pain   . Ectopic pregnancy   . Depression   . Manic depression (HCC)    Past Surgical History  Procedure Laterality Date  .  Ventriculoperitoneal shunt    . Laproscopy    . Cholecystectomy    . Back surgery    . Abdominal hysterectomy    . Brain surgery     History reviewed. No pertinent family history. Social History  Substance Use Topics  . Smoking status: Current Every Day Smoker -- 0.25 packs/day    Types: Cigarettes  . Smokeless tobacco: None  . Alcohol Use: Yes     Comment: occasional   OB History    No data available     Review of Systems  Ten systems reviewed and are negative for acute change, except as noted in the HPI.    Allergies  Buprenorphine hcl; Morphine and related; Kiwi extract; Promethazine; Latex; Percocet; and Tape  Home Medications   Prior to Admission medications   Medication Sig Start Date End Date Taking? Authorizing Provider  gabapentin (NEURONTIN) 100 MG capsule Take 100 mg by mouth 3 (three) times daily. 09/16/14  Yes Historical Provider, MD  Multiple Vitamin (MULTIVITAMIN) tablet Take 1 tablet by mouth daily.   Yes Historical Provider, MD  Omega-3 Fatty Acids (FISH OIL) 1000 MG CAPS Take 1,000 capsules by mouth daily.   Yes Historical Provider, MD  pantoprazole (PROTONIX) 40 MG tablet Take 40 mg by mouth daily.   Yes Historical Provider, MD  triamcinolone (NASACORT AQ) 55 MCG/ACT AERO nasal inhaler Place 2 sprays into the nose daily as needed (for congestion).    Yes Historical Provider, MD  zolpidem (AMBIEN) 10 MG tablet Take 10 mg by mouth at bedtime as needed for sleep.  06/30/14  Yes Historical Provider, MD  lamoTRIgine (LAMICTAL) 100 MG tablet Take 25 mg by mouth daily as needed (for depression).  08/13/12   Historical Provider, MD  promethazine (PHENERGAN) 25 MG tablet Take 1 tablet (25 mg total) by mouth every 6 (six) hours as needed for nausea or vomiting (or headache). Patient not taking: Reported on 07/21/2015 05/24/15   Maris Berger, MD   BP 116/71 mmHg  Pulse 68  Temp(Src) 98.3 F (36.8 C) (Oral)  Resp 18  Ht  (1.702 m)  Wt 83.915 kg  BMI 28.97 kg/m2   SpO2 99% Physical Exam  Constitutional: She is oriented to person, place, and time. She appears well-developed and well-nourished. No distress.  HENT:  Head: Normocephalic and atraumatic.  Eyes: Conjunctivae are normal. No scleral icterus.  Neck: Normal range of motion.  Cardiovascular: Normal rate, regular rhythm and normal heart sounds.  Exam reveals no gallop and no friction rub.   No murmur heard. Pulmonary/Chest: Effort normal and breath sounds normal. No respiratory distress.  Abdominal: Soft. Bowel sounds are normal. She exhibits no distension and no mass. There is tenderness (Tenderness around the umbilicus). There is no guarding.  Neurological: She is alert and oriented to person, place, and time.  Skin: Skin is warm and dry. She is not diaphoretic.  Nursing note and vitals reviewed.   ED Course  Procedures (including critical care time) Labs Review Labs Reviewed  WET PREP, GENITAL  COMPREHENSIVE METABOLIC PANEL  LIPASE, BLOOD  CBC WITH DIFFERENTIAL/PLATELET  URINALYSIS, ROUTINE W REFLEX MICROSCOPIC (NOT AT [facility])  RPR  HIV ANTIBODY (ROUTINE TESTING)  I-STAT BETA HCG BLOOD, ED ([facility], [facility], [facility] ONLY)  GC/CHLAMYDIA PROBE AMP ([place]) NOT AT [facility]    Imaging Review No results found. I have personally reviewed and evaluated these images and lab results as part of my medical decision-making.   EKG Interpretation None      MDM   Final diagnoses:  None    Patient here with umbilical and lower quadrant abdominal pain. She is tender to palpation on examination. Patient is also very worried. I tried to reassure the patient. Going to try oral meclizine for her nausea. Since she tends, although problem with chronic nausea and this may be very helpful for her. We'll give fluids, pain medication, workup pending including pelvic examination with wet prep and DNA probes along with chemistry panel, liver function tests, blood counts, lipase.  Pelvic exam: Normal external  genitalia, normal vagina and cervix. No cmt or adnexal masses. Patient continues to c/o abdominal pain and nausea. WIll obtain CT abdomen and pelvis. I HAVE GIVEN REPORT TO PA [facility] WILL ASSUME CARE     Arthor Captain, PA-C 07/23/15 1953  Marily Memos, MD 07/31/15 [phone]

## 2015-07-21 NOTE — ED Notes (Signed)
Pt reports nausea for the past couple of days. She also reports noticing blood when she wipes, unsure of source. Pt reports having a partial hysterectomy in 2009.

## 2015-07-21 NOTE — ED Notes (Signed)
Pt ambulated to restroom. 

## 2015-07-22 LAB — RPR: RPR Ser Ql: NONREACTIVE

## 2015-07-22 LAB — HIV ANTIBODY (ROUTINE TESTING W REFLEX): HIV SCREEN 4TH GENERATION: NONREACTIVE

## 2015-07-24 LAB — GC/CHLAMYDIA PROBE AMP (~~LOC~~) NOT AT ARMC
Chlamydia: NEGATIVE
Neisseria Gonorrhea: NEGATIVE

## 2015-09-25 ENCOUNTER — Emergency Department (HOSPITAL_COMMUNITY): Payer: Medicare Other

## 2015-09-25 ENCOUNTER — Encounter (HOSPITAL_COMMUNITY): Payer: Self-pay

## 2015-09-25 ENCOUNTER — Emergency Department (HOSPITAL_COMMUNITY)
Admission: EM | Admit: 2015-09-25 | Discharge: 2015-09-26 | Disposition: A | Discharge status: Home / Self Care | Payer: Medicare Other | Attending: Emergency Medicine | Admitting: Emergency Medicine

## 2015-09-25 DIAGNOSIS — R197 Diarrhea, unspecified: Secondary | ICD-10-CM | POA: Diagnosis not present

## 2015-09-25 DIAGNOSIS — R42 Dizziness and giddiness: Secondary | ICD-10-CM | POA: Diagnosis not present

## 2015-09-25 DIAGNOSIS — R51 Headache: Secondary | ICD-10-CM | POA: Diagnosis not present

## 2015-09-25 DIAGNOSIS — Z9049 Acquired absence of other specified parts of digestive tract: Secondary | ICD-10-CM | POA: Diagnosis not present

## 2015-09-25 DIAGNOSIS — Z79899 Other long term (current) drug therapy: Secondary | ICD-10-CM | POA: Diagnosis not present

## 2015-09-25 DIAGNOSIS — R1032 Left lower quadrant pain: Secondary | ICD-10-CM | POA: Diagnosis not present

## 2015-09-25 DIAGNOSIS — Z91040 Latex allergy status: Secondary | ICD-10-CM | POA: Insufficient documentation

## 2015-09-25 DIAGNOSIS — F17210 Nicotine dependence, cigarettes, uncomplicated: Secondary | ICD-10-CM | POA: Insufficient documentation

## 2015-09-25 DIAGNOSIS — K219 Gastro-esophageal reflux disease without esophagitis: Secondary | ICD-10-CM | POA: Insufficient documentation

## 2015-09-25 DIAGNOSIS — F419 Anxiety disorder, unspecified: Secondary | ICD-10-CM | POA: Diagnosis not present

## 2015-09-25 DIAGNOSIS — G8929 Other chronic pain: Secondary | ICD-10-CM | POA: Diagnosis not present

## 2015-09-25 DIAGNOSIS — F329 Major depressive disorder, single episode, unspecified: Secondary | ICD-10-CM | POA: Diagnosis not present

## 2015-09-25 DIAGNOSIS — Z90710 Acquired absence of both cervix and uterus: Secondary | ICD-10-CM | POA: Insufficient documentation

## 2015-09-25 DIAGNOSIS — Z982 Presence of cerebrospinal fluid drainage device: Secondary | ICD-10-CM | POA: Diagnosis not present

## 2015-09-25 DIAGNOSIS — R112 Nausea with vomiting, unspecified: Secondary | ICD-10-CM | POA: Diagnosis present

## 2015-09-25 LAB — URINALYSIS, ROUTINE W REFLEX MICROSCOPIC
BILIRUBIN URINE: NEGATIVE
GLUCOSE, UA: NEGATIVE mg/dL
HGB URINE DIPSTICK: NEGATIVE
KETONES UR: NEGATIVE mg/dL
LEUKOCYTES UA: NEGATIVE
Nitrite: NEGATIVE
PROTEIN: NEGATIVE mg/dL
Specific Gravity, Urine: 1.012 (ref 1.005–1.030)
pH: 6.5 (ref 5.0–8.0)

## 2015-09-25 LAB — COMPREHENSIVE METABOLIC PANEL
ALT: 12 U/L — AB (ref 14–54)
AST: 20 U/L (ref 15–41)
Albumin: 4.3 g/dL (ref 3.5–5.0)
Alkaline Phosphatase: 54 U/L (ref 38–126)
Anion gap: 12 (ref 5–15)
BILIRUBIN TOTAL: 1.2 mg/dL (ref 0.3–1.2)
CO2: 21 mmol/L — ABNORMAL LOW (ref 22–32)
CREATININE: 0.7 mg/dL (ref 0.44–1.00)
Calcium: 9.6 mg/dL (ref 8.9–10.3)
Chloride: 105 mmol/L (ref 101–111)
GFR calc Af Amer: >60 mL/min (ref >60)
GLUCOSE: 97 mg/dL (ref 65–99)
Potassium: 3.8 mmol/L (ref 3.5–5.1)
Sodium: 138 mmol/L (ref 135–145)
TOTAL PROTEIN: 7.3 g/dL (ref 6.5–8.1)

## 2015-09-25 LAB — CBC
HCT: 43.5 % (ref 36.0–46.0)
Hemoglobin: 14.4 g/dL (ref 12.0–15.0)
MCH: 28.2 pg (ref 26.0–34.0)
MCHC: 33.1 g/dL (ref 30.0–36.0)
MCV: 85.3 fL (ref 78.0–100.0)
PLATELETS: 243 10*3/uL (ref 150–400)
RBC: 5.1 MIL/uL (ref 3.87–5.11)
RDW: 13 % (ref 11.5–15.5)
WBC: 9.7 10*3/uL (ref 4.0–10.5)

## 2015-09-25 LAB — LIPASE, BLOOD: Lipase: 18 U/L (ref 11–51)

## 2015-09-25 MED ORDER — ONDANSETRON 4 MG PO TBDP
4.0000 mg | ORAL_TABLET | Freq: Once | ORAL | Status: AC | PRN
  Administered 2015-09-25: 4 mg via ORAL

## 2015-09-25 MED ORDER — SODIUM CHLORIDE 0.9 % IV BOLUS (SEPSIS)
1000.0000 mL | Freq: Once | INTRAVENOUS | Status: AC
  Administered 2015-09-25: 1000 mL via INTRAVENOUS

## 2015-09-25 MED ORDER — HYDROMORPHONE HCL 1 MG/ML IJ SOLN
0.5000 mg | Freq: Once | INTRAMUSCULAR | Status: AC
  Administered 2015-09-26: 0.5 mg via INTRAVENOUS
  Filled 2015-09-25: qty 1

## 2015-09-25 MED ORDER — IOPAMIDOL (ISOVUE-300) INJECTION 61%
INTRAVENOUS | Status: AC
  Administered 2015-09-25: 100 mL
  Filled 2015-09-25: qty 100

## 2015-09-25 MED ORDER — HYDROMORPHONE HCL 1 MG/ML IJ SOLN
0.5000 mg | Freq: Once | INTRAMUSCULAR | Status: AC
  Administered 2015-09-25: 0.5 mg via INTRAVENOUS
  Filled 2015-09-25: qty 1

## 2015-09-25 MED ORDER — PROCHLORPERAZINE EDISYLATE 5 MG/ML IJ SOLN
10.0000 mg | Freq: Once | INTRAMUSCULAR | Status: AC
  Administered 2015-09-25: 10 mg via INTRAVENOUS
  Filled 2015-09-25: qty 2

## 2015-09-25 MED ORDER — ONDANSETRON 4 MG PO TBDP
ORAL_TABLET | ORAL | Status: AC
Start: 2015-09-25 — End: 2015-09-25
  Administered 2015-09-25: 4 mg via ORAL
  Filled 2015-09-25: qty 1

## 2015-09-25 NOTE — ED Notes (Signed)
Patient transported to CT 

## 2015-09-25 NOTE — ED Provider Notes (Signed)
CSN: [id number]     Arrival date & time 09/25/15  1537 History   First MD Initiated Contact with Patient 09/25/15 2126     Chief Complaint  Patient presents with  . Nausea  . Emesis  . Numbness     (Consider location/radiation/quality/duration/timing/severity/associated sxs/prior Treatment) HPI   Patient has a hx of ectopic pregnancy with hysterectomy, depression, manic depression, hx of 3 brain surgeries with a ventriculoperitoneal shunt, hx of back surgery, chronic abdominal pain with diarrhea and vomiting, hx of colitis and diverticulitis, hx of cholecystectomy, small hiatal hernia with hx of GERD.  Two days ago she drank alcohol at a party and by the time she got home she started feeling dizziness, headache, nausea, vomiting and diarrhea. The patient is crying. Taking a history is difficult because she reports having chronic dizziness, headache, nausea, vomiting and diarrhea. She reports coming in today because her abdominal pain in the LLQ is worse than normal and that she has been feeling poorly over the last two days. Otherwise she states the other issues are chronic but worse than normal. Denies abnormal blood or bile in her vomit.   Past Medical History  Diagnosis Date  . Chronic back pain   . Ectopic pregnancy   . Depression   . Manic depression (HCC)    Past Surgical History  Procedure Laterality Date  . Ventriculoperitoneal shunt    . Laproscopy    . Cholecystectomy    . Back surgery    . Abdominal hysterectomy    . Brain surgery     History reviewed. No pertinent family history. Social History  Substance Use Topics  . Smoking status: Current Every Day Smoker -- 0.25 packs/day    Types: Cigarettes  . Smokeless tobacco: None  . Alcohol Use: Yes     Comment: occasional   OB History    No data available     Review of Systems  Review of Systems All other systems negative except as documented in the HPI. All pertinent positives and negatives as reviewed in the  HPI.   Allergies  Buprenorphine hcl; Morphine and related; Kiwi extract; Promethazine; Latex; Percocet; and Tape  Home Medications   Prior to Admission medications   Medication Sig Start Date End Date Taking? Authorizing Provider  gabapentin (NEURONTIN) 100 MG capsule Take 100 mg by mouth 3 (three) times daily. 09/16/14  Yes Historical Provider, MD  lamoTRIgine (LAMICTAL) 100 MG tablet Take 25 mg by mouth daily as needed (for depression).  08/13/12  Yes Historical Provider, MD  Multiple Vitamin (MULTIVITAMIN) tablet Take 1 tablet by mouth daily.   Yes Historical Provider, MD  Omega-3 Fatty Acids (FISH OIL) 1000 MG CAPS Take 1,000 capsules by mouth daily.   Yes Historical Provider, MD  pantoprazole (PROTONIX) 40 MG tablet Take 40 mg by mouth daily.   Yes Historical Provider, MD  traMADol (ULTRAM) 50 MG tablet Take 1 tablet (50 mg total) by mouth every 6 (six) hours as needed for moderate pain. 07/21/15  Yes Fayrene Helper, PA-C  triamcinolone (NASACORT AQ) 55 MCG/ACT AERO nasal inhaler Place 2 sprays into the nose daily as needed (for congestion).    Yes Historical Provider, MD  zolpidem (AMBIEN) 10 MG tablet Take 10 mg by mouth at bedtime as needed for sleep.  06/30/14  Yes Historical Provider, MD  ondansetron (ZOFRAN) 4 MG tablet Take 1 tablet (4 mg total) by mouth every 6 (six) hours. 09/26/15   Cera Rorke Neva Seat, PA-C   BP 104/63 mmHg  Pulse 58  Temp(Src) 98.6 F (37 C) (Oral)  Resp 20  Ht  (1.702 m)  SpO2 100% Physical Exam  Constitutional: She is oriented to person, place, and time. She appears well-developed and well-nourished. No distress.  HENT:  Head: Normocephalic and atraumatic.  Right Ear: Tympanic membrane and ear canal normal.  Left Ear: Tympanic membrane and ear canal normal.  Nose: Nose normal.  Mouth/Throat: Uvula is midline, oropharynx is clear and moist and mucous membranes are normal.  Eyes: Pupils are equal, round, and reactive to light.  Neck: Normal range of motion.  Neck supple.  Cardiovascular: Normal rate and regular rhythm.   Pulmonary/Chest: Effort normal. No accessory muscle usage. No respiratory distress. She has no decreased breath sounds. She has no wheezes. She has no rhonchi.  Abdominal: Soft. Bowel sounds are normal. There is tenderness in the left lower quadrant. There is no rigidity, no rebound and no guarding.  No signs of abdominal distention  Musculoskeletal:  No LE swelling  Neurological: She is alert and oriented to person, place, and time. She has normal strength. She displays a negative Romberg sign.  Acting at baseline  Skin: Skin is warm and dry. No rash noted. She is not diaphoretic.  Psychiatric: Her mood appears anxious. She exhibits a depressed mood.  +tearful  Nursing note and vitals reviewed.   ED Course  Procedures (including critical care time) Labs Review Labs Reviewed  COMPREHENSIVE METABOLIC PANEL - Abnormal; Notable for the following:    CO2 21 (*)    BUN <5 (*)    ALT 12 (*)    All other components within normal limits  LIPASE, BLOOD  CBC  URINALYSIS, ROUTINE W REFLEX MICROSCOPIC (NOT AT [facility])  POC URINE PREG, ED    Imaging Review Ct Head Wo Contrast  09/25/2015  CLINICAL DATA:  Right-sided injury and numbness after a fall today. Nausea. Headache. History of Chiari malformation. EXAM: CT HEAD WITHOUT CONTRAST TECHNIQUE: Contiguous axial images were obtained from the base of the skull through the vertex without intravenous contrast. COMPARISON:  05/24/2015 FINDINGS: Postoperative changes with suboccipital craniostomy. Right trans parietal ventricular shunt tube with tip in the left lateral ventricle. Ventricles are decompressed. Cerebellar tonsillar ectopia. No mass effect or midline shift. No abnormal extra-axial fluid collections. Gray-white matter junctions are distinct. Basal cisterns are not effaced. No acute intracranial hemorrhage. Small fluid levels in the maxillary antra bilaterally. Mild mucosal  thickening in the paranasal sinuses. Mastoid air cells are not opacified. No acute depressed skull fractures. IMPRESSION: No acute intracranial abnormalities. Right ventricular shunt with decompression of the ventricles. Cerebellar tonsillar ectopia with suboccipital craniostomy. Electronically Signed   By: Burman Nieves M.D.   On: 09/25/2015 21:52   I have personally reviewed and evaluated these images and lab results as part of my medical decision-making.   EKG Interpretation None      MDM   Final diagnoses:  Nausea vomiting and diarrhea   10: 36 pm Patient head CT shows no significant abnormalities and decompressed ventricles with no signs of complications to her shunt. Her symptoms started after drinking alcohol and are accompanied by abdominal pain, N/V/D. Unclear what symptoms are chronic, new or worse. Will need to obtain CT scan to evaluate her LLQ abdominal pain. Otherwise her UA, lipase, CMP and CBC are unremarkable.  12: 05 am Patient with hx of multiple medical problems: Medications  ondansetron (ZOFRAN-ODT) disintegrating tablet 4 mg (4 mg Oral Given 09/25/15 1625)  prochlorperazine (COMPAZINE) injection 10  mg (10 mg Intravenous Given 09/25/15 2258)  HYDROmorphone (DILAUDID) injection 0.5 mg (0.5 mg Intravenous Given 09/25/15 2258)  sodium chloride 0.9 % bolus 1,000 mL (1,000 mLs Intravenous New Bag/Given 09/25/15 2258)  iopamidol (ISOVUE-300) 61 % injection (100 mLs  Contrast Given 09/25/15 2313)  HYDROmorphone (DILAUDID) injection 0.5 mg (0.5 mg Intravenous Given 09/26/15 0006)    Normal UA Lipase normal CMP normal CBC normal CT abd/pelv shows no abnormalities within the pelvis or abdomen. Patient requesting fluid, will give PO challenge.  12:38 am The patient was able to pass the fluid challenge, reports that she is feeling better and that Zofran has worked really well.  Filed Vitals:   09/25/15 2300 09/26/15 0000  BP: 118/73 104/63  Pulse: 61 58  Temp:     Resp:        Marlon Peliffany Keylani Perlstein, PA-C 09/26/15 0039  Lavera Guiseana Duo Liu, MD 09/26/15 [phone]

## 2015-09-25 NOTE — ED Notes (Signed)
Pt complaining of N/V since yesterday. Pt became dizziness struck r side of head on sink. Pt has a shunt for kiori malformation. Pt complaining of R sided facial numbness/tingling.

## 2015-09-26 DIAGNOSIS — R112 Nausea with vomiting, unspecified: Secondary | ICD-10-CM | POA: Diagnosis not present

## 2015-09-26 MED ORDER — ONDANSETRON HCL 4 MG PO TABS: 4.0000 mg | ORAL_TABLET | Freq: Four times a day (QID) | ORAL | Status: DC

## 2015-09-26 NOTE — ED Notes (Signed)
Pt left with all her belongings and ambulated out of the treatment area.  

## 2015-09-26 NOTE — Discharge Instructions (Signed)
Nausea and Vomiting  Nausea is a sick feeling that often comes before throwing up (vomiting). Vomiting is a reflex where stomach contents come out of your mouth. Vomiting can cause severe loss of body fluids (dehydration). Children and elderly adults can become dehydrated quickly, especially if they also have diarrhea. Nausea and vomiting are symptoms of a condition or disease. It is important to find the cause of your symptoms.  CAUSES   · Direct irritation of the stomach lining. This irritation can result from increased acid production (gastroesophageal reflux disease), infection, food poisoning, taking certain medicines (such as nonsteroidal anti-inflammatory drugs), alcohol use, or tobacco use.  · Signals from the brain. These signals could be caused by a headache, heat exposure, an inner ear disturbance, increased pressure in the brain from injury, infection, a tumor, or a concussion, pain, emotional stimulus, or metabolic problems.  · An obstruction in the gastrointestinal tract (bowel obstruction).  · Illnesses such as diabetes, hepatitis, gallbladder problems, appendicitis, kidney problems, cancer, sepsis, atypical symptoms of a heart attack, or eating disorders.  · Medical treatments such as chemotherapy and radiation.  · Receiving medicine that makes you sleep (general anesthetic) during surgery.  DIAGNOSIS  Your caregiver may ask for tests to be done if the problems do not improve after a few days. Tests may also be done if symptoms are severe or if the reason for the nausea and vomiting is not clear. Tests may include:  · Urine tests.  · Blood tests.  · Stool tests.  · Cultures (to look for evidence of infection).  · X-rays or other imaging studies.  Test results can help your caregiver make decisions about treatment or the need for additional tests.  TREATMENT  You need to stay well hydrated. Drink frequently but in small amounts. You may wish to drink water, sports drinks, clear broth, or eat frozen  ice pops or gelatin dessert to help stay hydrated. When you eat, eating slowly may help prevent nausea. There are also some antinausea medicines that may help prevent nausea.  HOME CARE INSTRUCTIONS   · Take all medicine as directed by your caregiver.  · If you do not have an appetite, do not force yourself to eat. However, you must continue to drink fluids.  · If you have an appetite, eat a normal diet unless your caregiver tells you differently.    Eat a variety of complex carbohydrates (rice, wheat, potatoes, bread), lean meats, yogurt, fruits, and vegetables.    Avoid high-fat foods because they are more difficult to digest.  · Drink enough water and fluids to keep your urine clear or pale yellow.  · If you are dehydrated, ask your caregiver for specific rehydration instructions. Signs of dehydration may include:    Severe thirst.    Dry lips and mouth.    Dizziness.    Dark urine.    Decreasing urine frequency and amount.    Confusion.    Rapid breathing or pulse.  SEEK IMMEDIATE MEDICAL CARE IF:   · You have blood or brown flecks (like coffee grounds) in your vomit.  · You have black or bloody stools.  · You have a severe headache or stiff neck.  · You are confused.  · You have severe abdominal pain.  · You have chest pain or trouble breathing.  · You do not urinate at least once every 8 hours.  · You develop cold or clammy skin.  · You continue to vomit for longer than 24 to 48 hours.  ·   You have a fever.  MAKE SURE YOU:   · Understand these instructions.  · Will watch your condition.  · Will get help right away if you are not doing well or get worse.     This information is not intended to replace advice given to you by your health care provider. Make sure you discuss any questions you have with your health care provider.     Document Released: 05/27/2005 Document Revised: 08/19/2011 Document Reviewed: 10/24/2010  Elsevier Interactive Patient Education ©2016 [employer]      Food Choices to Help Relieve  Diarrhea, Adult  When you have diarrhea, the foods you eat and your eating habits are very important. Choosing the right foods and drinks can help relieve diarrhea. Also, because diarrhea can last up to 7 days, you need to replace lost fluids and electrolytes (such as sodium, potassium, and chloride) in order to help prevent dehydration.   WHAT GENERAL GUIDELINES DO I NEED TO FOLLOW?  · Slowly drink 1 cup (8 oz) of fluid for each episode of diarrhea. If you are getting enough fluid, your urine will be clear or pale yellow.  · Eat starchy foods. Some good choices include white rice, white toast, pasta, low-fiber cereal, baked potatoes (without the skin), saltine crackers, and bagels.  · Avoid large servings of any cooked vegetables.  · Limit fruit to two servings per day. A serving is ½ cup or 1 small piece.  · Choose foods with less than 2 g of fiber per serving.  · Limit fats to less than 8 tsp (38 g) per day.  · Avoid fried foods.  · Eat foods that have probiotics in them. Probiotics can be found in certain dairy products.  · Avoid foods and beverages that may increase the speed at which food moves through the stomach and intestines (gastrointestinal tract). Things to avoid include:  ¨ High-fiber foods, such as dried fruit, raw fruits and vegetables, nuts, seeds, and whole grain foods.  ¨ Spicy foods and high-fat foods.  ¨ Foods and beverages sweetened with high-fructose corn syrup, honey, or sugar alcohols such as xylitol, sorbitol, and mannitol.  WHAT FOODS ARE RECOMMENDED?  Grains  White rice. White, French, or pita breads (fresh or toasted), including plain rolls, buns, or bagels. White pasta. Saltine, soda, or graham crackers. Pretzels. Low-fiber cereal. Cooked cereals made with water (such as cornmeal, farina, or cream cereals). Plain muffins. Matzo. Melba toast. Zwieback.   Vegetables  Potatoes (without the skin). Strained tomato and vegetable juices. Most well-cooked and canned vegetables without seeds.  Tender lettuce.  Fruits  Cooked or canned applesauce, apricots, cherries, fruit cocktail, grapefruit, peaches, pears, or plums. Fresh bananas, apples without skin, cherries, grapes, cantaloupe, grapefruit, peaches, oranges, or plums.   Meat and Other Protein Products  Baked or boiled chicken. Eggs. Tofu. Fish. Seafood. Smooth peanut butter. Ground or well-cooked tender beef, ham, veal, lamb, pork, or poultry.   Dairy  Plain yogurt, kefir, and unsweetened liquid yogurt. Lactose-free milk, buttermilk, or soy milk. Plain hard cheese.  Beverages  Sport drinks. Clear broths. Diluted fruit juices (except prune). Regular, caffeine-free sodas such as ginger ale. Water. Decaffeinated teas. Oral rehydration solutions. Sugar-free beverages not sweetened with sugar alcohols.  Other  Bouillon, broth, or soups made from recommended foods.   The items listed above may not be a complete list of recommended foods or beverages. Contact your dietitian for more options.  WHAT FOODS ARE NOT RECOMMENDED?  Grains  Whole grain, whole   wheat, bran, or rye breads, rolls, pastas, crackers, and cereals. Wild or brown rice. Cereals that contain more than 2 g of fiber per serving. Corn tortillas or taco shells. Cooked or dry oatmeal. Granola. Popcorn.  Vegetables  Raw vegetables. Cabbage, broccoli, Brussels sprouts, artichokes, baked beans, beet greens, corn, kale, legumes, peas, sweet potatoes, and yams. Potato skins. Cooked spinach and cabbage.  Fruits  Dried fruit, including raisins and dates. Raw fruits. Stewed or dried prunes. Fresh apples with skin, apricots, mangoes, pears, raspberries, and strawberries.   Meat and Other Protein Products  Chunky peanut butter. Nuts and seeds. Beans and lentils. Bacon.   Dairy  High-fat cheeses. Milk, chocolate milk, and beverages made with milk, such as milk shakes. Cream. Ice cream.  Sweets and Desserts  Sweet rolls, doughnuts, and sweet breads. Pancakes and waffles.  Fats and Oils  Butter. Cream sauces.  Margarine. Salad oils. Plain salad dressings. Olives. Avocados.   Beverages  Caffeinated beverages (such as coffee, tea, soda, or energy drinks). Alcoholic beverages. Fruit juices with pulp. Prune juice. Soft drinks sweetened with high-fructose corn syrup or sugar alcohols.  Other  Coconut. Hot sauce. Chili powder. Mayonnaise. Gravy. Cream-based or milk-based soups.   The items listed above may not be a complete list of foods and beverages to avoid. Contact your dietitian for more information.  WHAT SHOULD I DO IF I BECOME DEHYDRATED?  Diarrhea can sometimes lead to dehydration. Signs of dehydration include dark urine and dry mouth and skin. If you think you are dehydrated, you should rehydrate with an oral rehydration solution. These solutions can be purchased at pharmacies, retail stores, or online.   Drink ½-1 cup (120-240 mL) of oral rehydration solution each time you have an episode of diarrhea. If drinking this amount makes your diarrhea worse, try drinking smaller amounts more often. For example, drink 1-3 tsp (5-15 mL) every 5-10 minutes.   A general rule for staying hydrated is to drink 1½-2 L of fluid per day. Talk to your health care provider about the specific amount you should be drinking each day. Drink enough fluids to keep your urine clear or pale yellow.     This information is not intended to replace advice given to you by your health care provider. Make sure you discuss any questions you have with your health care provider.     Document Released: 08/17/2003 Document Revised: 06/17/2014 Document Reviewed: 04/19/2013  Elsevier Interactive Patient Education ©2016 [employer]

## 2016-01-29 ENCOUNTER — Encounter (HOSPITAL_COMMUNITY): Payer: Self-pay | Admitting: *Deleted

## 2016-01-29 ENCOUNTER — Emergency Department (HOSPITAL_COMMUNITY): Payer: Medicare Other

## 2016-01-29 ENCOUNTER — Emergency Department (HOSPITAL_COMMUNITY)
Admission: EM | Admit: 2016-01-29 | Discharge: 2016-01-29 | Disposition: A | Discharge status: Home / Self Care | Payer: Medicare Other | Attending: Emergency Medicine | Admitting: Emergency Medicine

## 2016-01-29 DIAGNOSIS — Y929 Unspecified place or not applicable: Secondary | ICD-10-CM | POA: Insufficient documentation

## 2016-01-29 DIAGNOSIS — Y999 Unspecified external cause status: Secondary | ICD-10-CM | POA: Diagnosis not present

## 2016-01-29 DIAGNOSIS — F17210 Nicotine dependence, cigarettes, uncomplicated: Secondary | ICD-10-CM | POA: Diagnosis not present

## 2016-01-29 DIAGNOSIS — S93402A Sprain of unspecified ligament of left ankle, initial encounter: Secondary | ICD-10-CM | POA: Diagnosis not present

## 2016-01-29 DIAGNOSIS — Y939 Activity, unspecified: Secondary | ICD-10-CM | POA: Insufficient documentation

## 2016-01-29 DIAGNOSIS — X58XXXA Exposure to other specified factors, initial encounter: Secondary | ICD-10-CM | POA: Diagnosis not present

## 2016-01-29 DIAGNOSIS — S99912A Unspecified injury of left ankle, initial encounter: Secondary | ICD-10-CM | POA: Diagnosis present

## 2016-01-29 DIAGNOSIS — Z91040 Latex allergy status: Secondary | ICD-10-CM | POA: Diagnosis not present

## 2016-01-29 MED ORDER — DIPHENHYDRAMINE HCL 25 MG PO CAPS
25.0000 mg | ORAL_CAPSULE | Freq: Once | ORAL | Status: AC
  Administered 2016-01-29: 25 mg via ORAL
  Filled 2016-01-29: qty 1

## 2016-01-29 MED ORDER — ONDANSETRON 4 MG PO TBDP
4.0000 mg | ORAL_TABLET | Freq: Once | ORAL | Status: AC
  Administered 2016-01-29: 4 mg via ORAL
  Filled 2016-01-29: qty 1

## 2016-01-29 MED ORDER — OXYCODONE-ACETAMINOPHEN 5-325 MG PO TABS: 1.0000 | ORAL_TABLET | Freq: Three times a day (TID) | ORAL | 0 refills | Status: DC | PRN

## 2016-01-29 MED ORDER — IBUPROFEN 800 MG PO TABS: 800.0000 mg | ORAL_TABLET | Freq: Three times a day (TID) | ORAL | 0 refills | Status: DC

## 2016-01-29 MED ORDER — OXYCODONE-ACETAMINOPHEN 5-325 MG PO TABS
1.0000 | ORAL_TABLET | Freq: Once | ORAL | Status: AC
  Administered 2016-01-29: 1 via ORAL
  Filled 2016-01-29: qty 1

## 2016-01-29 NOTE — ED Provider Notes (Signed)
[facility]-EMERGENCY DEPT Provider Note   CSN: [id number] Arrival date & time: 01/29/16  1039  By signing my name below, I, Linna DarnerRussell Turner, attest that this documentation has been prepared under the direction and in the presence of Danelle BerryLeisa Ashkan Chamberland, PA-C. Electronically Signed: Linna Darnerussell Turner, Scribe. 01/29/2016. 11:45 AM.  History   Chief Complaint Chief Complaint  Patient presents with  . Ankle Pain    The history is provided by the patient. No language interpreter was used.     HPI Comments: Beth Kennedy is a 45 y.o. female who presents to the Emergency Department complaining of sudden onset, constant, severe, sharp, shooting, left ankle pain beginning 2 nights ago. Pt went out for her birthday two nights ago and was wearing 4 inch heels. Pt states she woke up the following morning with severe left ankle pain. She does not remember falling or otherwise injuring her left ankle but endorses EtOH use the night of her birthday. She notes some numbness in her 3rd, 4th, and 5th left toes and associated loss of sensation. She reports swelling of her left ankle since onset. Pt also notes some pain in her left Achilles tendon. She endorses left ankle pain exacerbation with palpation to her left ankle, left foot/ankle movement, weight bearing, and ambulation. Pt states she has been ambulating with a significant limp due to pain. She states the pain shoots up her LLE. Pt has tried ibuprofen 600 mg, gabapentin (takes regularly), ice, and left leg elevation with minimal pain relief. Pt states she could not sleep last night due to pain. She denies left ankle bruising or any other associated symptoms.  Past Medical History:  Diagnosis Date  . Chronic back pain   . Depression   . Ectopic pregnancy   . Manic depression (HCC)     There are no active problems to display for this patient.   Past Surgical History:  Procedure Laterality Date  . ABDOMINAL HYSTERECTOMY    . BACK SURGERY    . BRAIN SURGERY      . CHOLECYSTECTOMY    . laproscopy    . VENTRICULOPERITONEAL SHUNT      OB History    No data available       Home Medications    Prior to Admission medications   Medication Sig Start Date End Date Taking? Authorizing Provider  gabapentin (NEURONTIN) 100 MG capsule Take 100 mg by mouth 3 (three) times daily. 09/16/14   Historical Provider, MD  lamoTRIgine (LAMICTAL) 100 MG tablet Take 25 mg by mouth daily as needed (for depression).  08/13/12   Historical Provider, MD  Multiple Vitamin (MULTIVITAMIN) tablet Take 1 tablet by mouth daily.    Historical Provider, MD  Omega-3 Fatty Acids (FISH OIL) 1000 MG CAPS Take 1,000 capsules by mouth daily.    Historical Provider, MD  ondansetron (ZOFRAN) 4 MG tablet Take 1 tablet (4 mg total) by mouth every 6 (six) hours. 09/26/15   Tiffany Neva SeatGreene, PA-C  pantoprazole (PROTONIX) 40 MG tablet Take 40 mg by mouth daily.    Historical Provider, MD  traMADol (ULTRAM) 50 MG tablet Take 1 tablet (50 mg total) by mouth every 6 (six) hours as needed for moderate pain. 07/21/15   Fayrene HelperBowie Tran, PA-C  triamcinolone (NASACORT AQ) 55 MCG/ACT AERO nasal inhaler Place 2 sprays into the nose daily as needed (for congestion).     Historical Provider, MD  zolpidem (AMBIEN) 10 MG tablet Take 10 mg by mouth at bedtime as needed for sleep.  06/30/14   Historical Provider, MD    Family History History reviewed. No pertinent family history.  Social History Social History  Substance Use Topics  . Smoking status: Current Every Day Smoker    Packs/day: 0.25    Types: Cigarettes  . Smokeless tobacco: Not on file  . Alcohol use Yes     Comment: occasional     Allergies   Buprenorphine hcl; Morphine and related; Kiwi extract; Promethazine; Latex; Percocet [oxycodone-acetaminophen]; and Tape   Review of Systems Review of Systems  All other systems reviewed and are negative.  A complete 10 system review of systems was obtained and all systems are negative except as noted  in the HPI and PMH.   Physical Exam Updated Vital Signs BP 121/85 (BP Location: Left Arm)   Pulse 83   Temp 98.5 F (36.9 C) (Oral)   Resp 18   SpO2 100%   Physical Exam  Constitutional: She is oriented to person, place, and time. She appears well-developed and well-nourished. No distress.  HENT:  Head: Normocephalic and atraumatic.  Eyes: Conjunctivae and EOM are normal.  Neck: Neck supple. No tracheal deviation present.  Cardiovascular: Normal rate.   Pulmonary/Chest: Effort normal. No respiratory distress.  Musculoskeletal: Normal range of motion.  Left lateral malleolus: edema, TTP, surrounding diffuse edema and diffusion with bruising and mild erythema. Tenderness extends into distal metatarsals 3-5. ROM limited secondary to pain. Decreased sensation to light touch in the lateral foot and toes 3-5. Achilles tendon non-tender to palpation, no defect palpated. Negative Thompson's. No calf tenderness to palpation. Left knee exam normal.  Neurological: She is alert and oriented to person, place, and time.  Skin: Skin is warm and dry.  Psychiatric: She has a normal mood and affect. Her behavior is normal.  Nursing note and vitals reviewed.   ED Treatments / Results  Labs (all labs ordered are listed, but only abnormal results are displayed) Labs Reviewed - No data to display  EKG  EKG Interpretation None       Radiology Dg Ankle Complete Left  Result Date: 01/29/2016 CLINICAL DATA:  Left ankle pain and swelling.  No known injury. EXAM: LEFT ANKLE COMPLETE - 3+ VIEW COMPARISON:  None. FINDINGS: The ankle mortise is maintained. Well corticated bony density near the lateral talus is most likely an old avulsion injury. No acute ankle fracture. No osteochondral lesion. The visualized mid and hindfoot bony structures are intact. IMPRESSION: No acute bony abnormality. Electronically Signed   By: Rudie MeyerP.  Gallerani M.D.   On: 01/29/2016 11:32    Procedures Procedures (including  critical care time)  DIAGNOSTIC STUDIES: Oxygen Saturation is 100% on RA, normal by my interpretation.    COORDINATION OF CARE: 11:45 AM Discussed treatment plan with pt at bedside and pt agreed to plan.  Medications Ordered in ED Medications - No data to display   Initial Impression / Assessment and Plan / ED Course  I have reviewed the triage vital signs and the nursing notes.  Pertinent labs & imaging results that were available during my care of the patient were reviewed by me and considered in my medical decision making (see chart for details).  Clinical Course  Pt with left ankle swelling, bruising and pain, occurred last night while wearing 4 inch heels, out drinking, does not remember any specific injury. Patient X-Ray negative for obvious fracture or dislocation. Pain managed in ED. Pt advised to follow up with orthopedics if symptoms persist for possibility of missed fracture diagnosis.  Patient given brace while in ED, conservative therapy recommended and discussed. Patient will be dc home & is agreeable with above plan.   Final Clinical Impressions(s) / ED Diagnoses   Final diagnoses:  Left ankle sprain, initial encounter    New Prescriptions Discharge Medication List as of 01/29/2016 12:25 PM    START taking these medications   Details  ibuprofen (ADVIL,MOTRIN) 800 MG tablet Take 1 tablet (800 mg total) by mouth 3 (three) times daily., Starting Mon 01/29/2016, Print    oxyCODONE-acetaminophen (PERCOCET) 5-325 MG tablet Take 1-2 tablets by mouth every 8 (eight) hours as needed for severe pain., Starting Mon 01/29/2016, Print         Danelle Berry, PA-C 01/31/16 1331    Mancel Bale, MD 02/01/16 1531

## 2016-01-29 NOTE — ED Triage Notes (Signed)
Pt reports +etoh use this weekend, now has left ankle pain and swelling and unsure of any injury.

## 2016-02-27 ENCOUNTER — Emergency Department (HOSPITAL_COMMUNITY)
Admission: EM | Admit: 2016-02-27 | Discharge: 2016-02-27 | Disposition: A | Discharge status: Home / Self Care | Payer: Medicare Other | Attending: Emergency Medicine | Admitting: Emergency Medicine

## 2016-02-27 ENCOUNTER — Encounter (HOSPITAL_COMMUNITY): Payer: Self-pay

## 2016-02-27 DIAGNOSIS — Z91040 Latex allergy status: Secondary | ICD-10-CM | POA: Insufficient documentation

## 2016-02-27 DIAGNOSIS — M546 Pain in thoracic spine: Secondary | ICD-10-CM | POA: Diagnosis not present

## 2016-02-27 DIAGNOSIS — F17210 Nicotine dependence, cigarettes, uncomplicated: Secondary | ICD-10-CM | POA: Insufficient documentation

## 2016-02-27 DIAGNOSIS — M62830 Muscle spasm of back: Secondary | ICD-10-CM

## 2016-02-27 DIAGNOSIS — M545 Low back pain: Secondary | ICD-10-CM | POA: Diagnosis present

## 2016-02-27 DIAGNOSIS — Z79899 Other long term (current) drug therapy: Secondary | ICD-10-CM | POA: Diagnosis not present

## 2016-02-27 MED ORDER — METHOCARBAMOL 500 MG PO TABS: 500.0000 mg | ORAL_TABLET | Freq: Two times a day (BID) | ORAL | 0 refills | Status: DC

## 2016-02-27 MED ORDER — KETOROLAC TROMETHAMINE 60 MG/2ML IM SOLN
60.0000 mg | Freq: Once | INTRAMUSCULAR | Status: AC
  Administered 2016-02-27: 60 mg via INTRAMUSCULAR
  Filled 2016-02-27: qty 2

## 2016-02-27 MED ORDER — OXYCODONE-ACETAMINOPHEN 5-325 MG PO TABS: 2.0000 | ORAL_TABLET | ORAL | 0 refills | Status: DC | PRN

## 2016-02-27 MED ORDER — OXYCODONE-ACETAMINOPHEN 5-325 MG PO TABS
1.0000 | ORAL_TABLET | Freq: Once | ORAL | Status: AC
  Administered 2016-02-27: 1 via ORAL
  Filled 2016-02-27: qty 1

## 2016-02-27 NOTE — ED Triage Notes (Signed)
Patient complains of lower back pain x 2 days, states that she has spasms and no relief with her meds, alert and oriented

## 2016-02-27 NOTE — ED Provider Notes (Signed)
[facility]-EMERGENCY DEPT Provider Note   CSN: [id number] Arrival date & time: 02/27/16  1191   By signing my name below, I, Beth Kennedy, attest that this documentation has been prepared under the direction and in the presence of non-physician practitioner, Gaylyn Rong, PA-C. Electronically Signed: Freida Kennedy, Scribe. 02/27/2016. 10:53 AM.   History   Chief Complaint Chief Complaint  Patient presents with  . Back Pain     The history is provided by the patient. No language interpreter was used.   HPI Comments:  Beth Kennedy is a 45 y.o. female with a history of multiple back surgeries and chronic back pain, who presents to the Emergency Department complaining of muscle spasms in her back since yesterday. She reports pain under her right ribs that radiates into her lower back.  Her pain is exacerbated with certain movements. Pt notes she spoke with Dr. Dutch Quint (neruosurgery) to try and schedule an appointment today but was unable to make one until next week, 03/07/16. Pt notes she takes Gabapentin daily which has not provided much relief for her pain today. She has also taken ibuprofen without relief and has also applied a Lidoderm patch which provided very minimal relief. She notes she is not on chronic pain meds. She denies neck pain/neck stiffness and  bowel/bladder incontinence.  Past Medical History:  Diagnosis Date  . Chronic back pain   . Depression   . Ectopic pregnancy   . Manic depression (HCC)     There are no active problems to display for this patient.   Past Surgical History:  Procedure Laterality Date  . ABDOMINAL HYSTERECTOMY    . BACK SURGERY    . BRAIN SURGERY    . CHOLECYSTECTOMY    . laproscopy    . VENTRICULOPERITONEAL SHUNT      OB History    No data available       Home Medications    Prior to Admission medications   Medication Sig Start Date End Date Taking? Authorizing Provider  gabapentin (NEURONTIN) 100 MG capsule Take 100 mg by  mouth 3 (three) times daily. 09/16/14   Historical Provider, MD  ibuprofen (ADVIL,MOTRIN) 800 MG tablet Take 1 tablet (800 mg total) by mouth 3 (three) times daily. 01/29/16   Danelle Berry, PA-C  lamoTRIgine (LAMICTAL) 100 MG tablet Take 25 mg by mouth daily as needed (for depression).  08/13/12   Historical Provider, MD  Multiple Vitamin (MULTIVITAMIN) tablet Take 1 tablet by mouth daily.    Historical Provider, MD  Omega-3 Fatty Acids (FISH OIL) 1000 MG CAPS Take 1,000 capsules by mouth daily.    Historical Provider, MD  ondansetron (ZOFRAN) 4 MG tablet Take 1 tablet (4 mg total) by mouth every 6 (six) hours. 09/26/15   Tiffany Neva Seat, PA-C  oxyCODONE-acetaminophen (PERCOCET) 5-325 MG tablet Take 1-2 tablets by mouth every 8 (eight) hours as needed for severe pain. 01/29/16   Danelle Berry, PA-C  pantoprazole (PROTONIX) 40 MG tablet Take 40 mg by mouth daily.    Historical Provider, MD  traMADol (ULTRAM) 50 MG tablet Take 1 tablet (50 mg total) by mouth every 6 (six) hours as needed for moderate pain. 07/21/15   Fayrene Helper, PA-C  triamcinolone (NASACORT AQ) 55 MCG/ACT AERO nasal inhaler Place 2 sprays into the nose daily as needed (for congestion).     Historical Provider, MD  zolpidem (AMBIEN) 10 MG tablet Take 10 mg by mouth at bedtime as needed for sleep.  06/30/14   Historical Provider, MD  Family History No family history on file.  Social History Social History  Substance Use Topics  . Smoking status: Current Every Day Smoker    Packs/day: 0.25    Types: Cigarettes  . Smokeless tobacco: Never Used  . Alcohol use Yes     Comment: occasional     Allergies   Buprenorphine hcl; Morphine and related; Kiwi extract; Promethazine; Latex; Percocet [oxycodone-acetaminophen]; and Tape   Review of Systems Review of Systems  10 systems reviewed and all are negative for acute change except as noted in the HPI.   Physical Exam Updated Vital Signs BP 117/83 (BP Location: Left Arm)   Pulse 74    Temp 98.2 F (36.8 C) (Oral)   Resp 18   SpO2 99%   Physical Exam  Constitutional: She is oriented to person, place, and time. She appears well-developed and well-nourished. No distress.  HENT:  Head: Normocephalic and atraumatic.  Eyes: Conjunctivae are normal. Right eye exhibits no discharge. Left eye exhibits no discharge. No scleral icterus.  Neck: Normal range of motion. Neck supple.  Cardiovascular: Normal rate.   Pulmonary/Chest: Effort normal.  Musculoskeletal:  Significant left thoracic and lumbar paraspinal muscle tenderness with muscle spasm; no midline TTP  Decreased ROM of T/L spine secondary to pain  Nml ROM of C-spine No stepoffs or obvious bony defority  Neurological: She is alert and oriented to person, place, and time. Coordination normal.  No sensory deficits or gait abnormality   Skin: Skin is warm and dry. No rash noted. She is not diaphoretic. No erythema. No pallor.  Psychiatric: She has a normal mood and affect. Her behavior is normal.  Nursing note and vitals reviewed.    ED Treatments / Results  DIAGNOSTIC STUDIES:  Oxygen Saturation is 99% on RA, normal by my interpretation.    COORDINATION OF CARE:  10:43 AM Discussed treatment plan with pt at bedside and pt agreed to plan.  Labs (all labs ordered are listed, but only abnormal results are displayed) Labs Reviewed - No data to display  EKG  EKG Interpretation None       Radiology No results found.  Procedures Procedures (including critical care time)  Medications Ordered in ED Medications - No data to display   Initial Impression / Assessment and Plan / ED Course  I have reviewed the triage vital signs and the nursing notes.  Pertinent labs & imaging results that were available during my care of the patient were reviewed by me and considered in my medical decision making (see chart for details).  Clinical Course    45 y.o F with hx of multiple back surgeries, chronic back pain  presents to the ED with acute exacerbation of chronic back pain.  No neurological deficits and normal neuro exam.  Patient is ambulatory.  No loss of bowel or bladder control.  No concern for cauda equina.  No fever, night sweats, weight loss, h/o cancer, IVDA, no recent procedure to back. No urinary symptoms suggestive of UTI.  Pt has neurosurgery follow up in 1.5 weeks with Dr. Dutch Quint. Pain managed in ED. Supportive care and return precaution discussed. Appears safe for discharge at this time. Follow up as indicated in discharge paperwork.   Final Clinical Impressions(s) / ED Diagnoses   Final diagnoses:  Muscle spasm of back  Left-sided thoracic back pain    New Prescriptions Discharge Medication List as of 02/27/2016 11:24 AM    START taking these medications   Details  methocarbamol (ROBAXIN) 500  MG tablet Take 1 tablet (500 mg total) by mouth 2 (two) times daily., Starting Tue 02/27/2016, Print    !! oxyCODONE-acetaminophen (PERCOCET/ROXICET) 5-325 MG tablet Take 2 tablets by mouth every 4 (four) hours as needed for severe pain., Starting Tue 02/27/2016, Print     !! - Potential duplicate medications found. Please discuss with provider.     I personally performed the services described in this documentation, which was scribed in my presence. The recorded information has been reviewed and is accurate.      Lester KinsmanSamantha Tripp [place] Cliff[place], PA-C 02/27/16 1434    Cathren LaineKevin Steinl, MD 02/27/16 [phone]

## 2016-02-27 NOTE — Discharge Instructions (Signed)
Take pain medication as needed. Increase your mobility. Follow up with your neurosurgeon for re-evaluation. Return to the ED if you experience inability to walk, new weakness, loss of control of your bowels or bladder.

## 2016-03-08 IMAGING — CT CT ABD-PELV W/ CM
2 of 5 series · 16 of 46 positions shown, 18 images · IV contrast (omnipaque)
Comparison: February 19, 2012

CLINICAL DATA: Upper abdominal pain

EXAM:
CT ABDOMEN AND PELVIS WITH CONTRAST
TECHNIQUE: Multidetector CT imaging of the abdomen and pelvis was performed
using the standard protocol following bolus administration of
intravenous contrast. Oral contrast was also administered.
CONTRAST:  100mL OMNIPAQUE IOHEXOL 300 MG/ML  SOLN

[Series 2: abd/ pelvis 5.0 i30f 1 · axial · 0.93mm/px · z∈[-311,+134]mm · 13 of 99 slices shown, 15 images]
[im 5/99  soft-tissue]
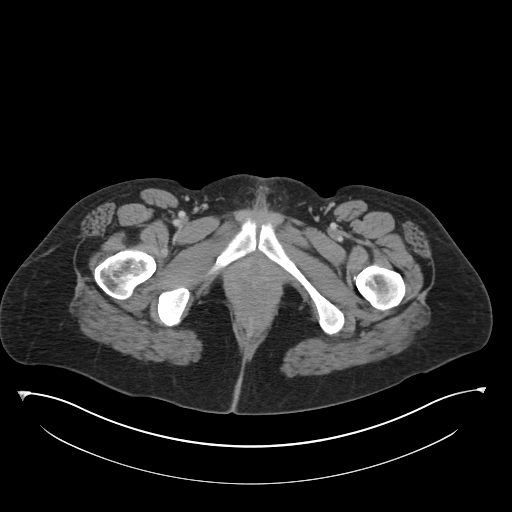
[im 5/99  bone]
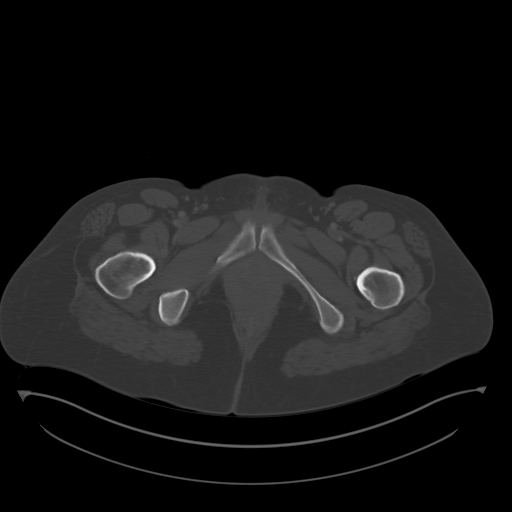
[im 15/99  soft-tissue]
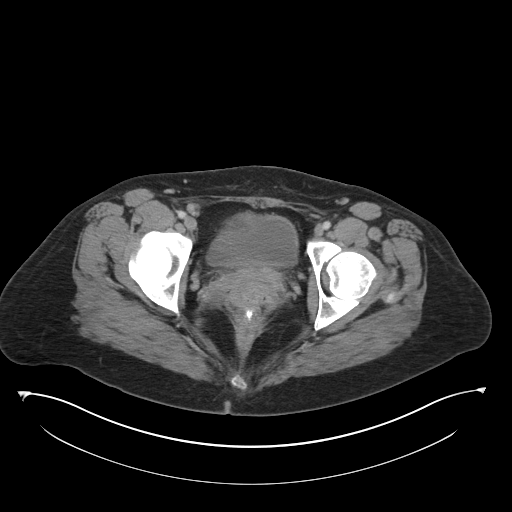
[im 20/99  soft-tissue]
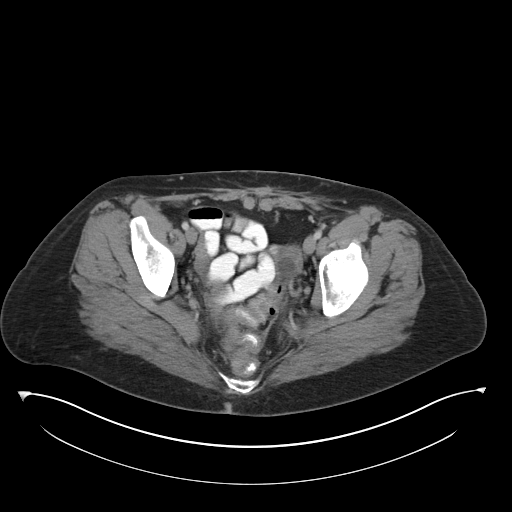
[im 30/99  soft-tissue]
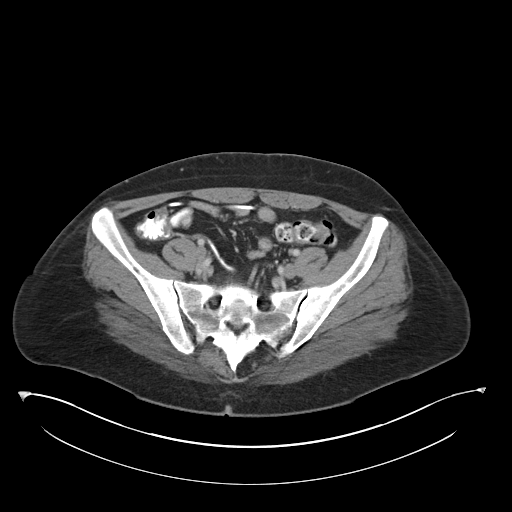
[im 35/99  soft-tissue]
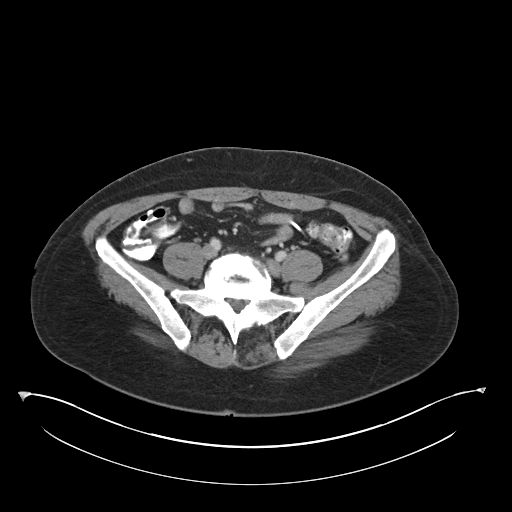
[im 45/99  soft-tissue]
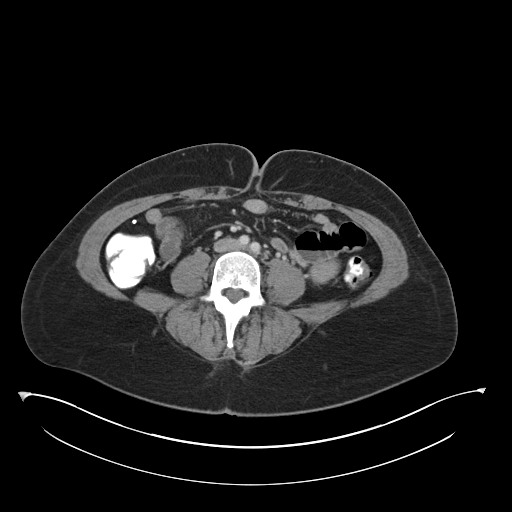
[im 50/99  soft-tissue]
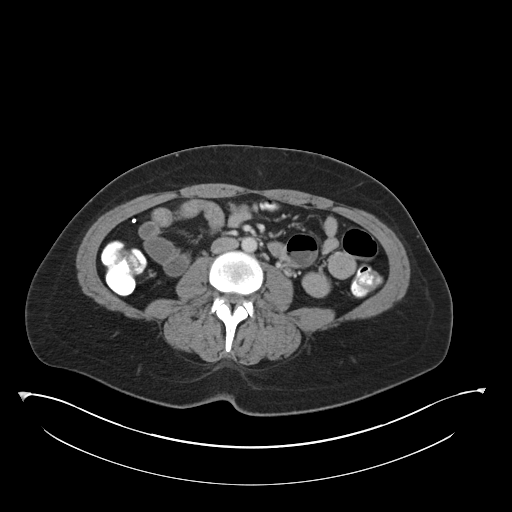
[im 54/99  soft-tissue]
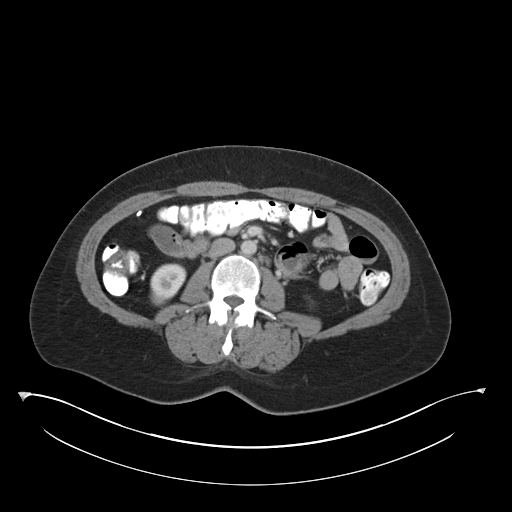
[im 64/99  soft-tissue]
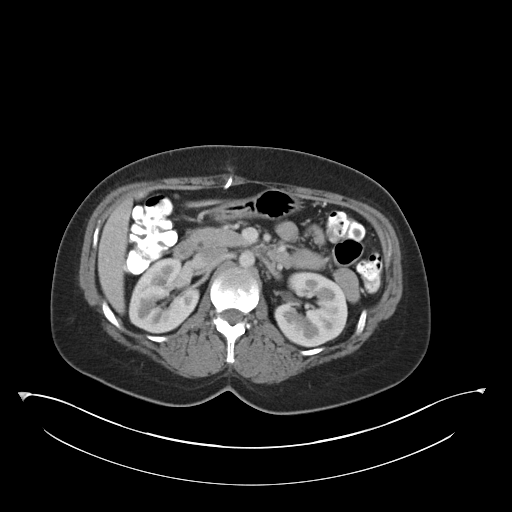
[im 64/99  bone]
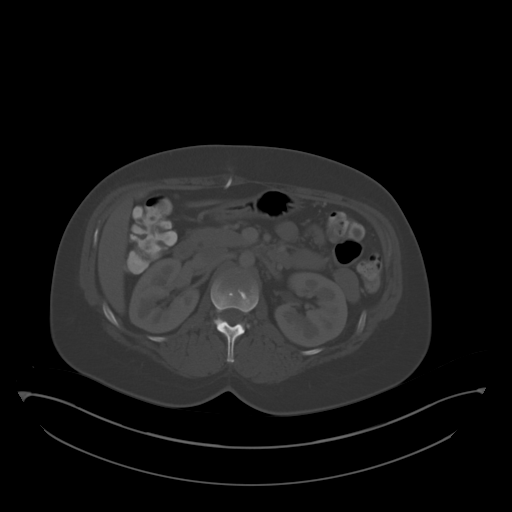
[im 69/99  soft-tissue]
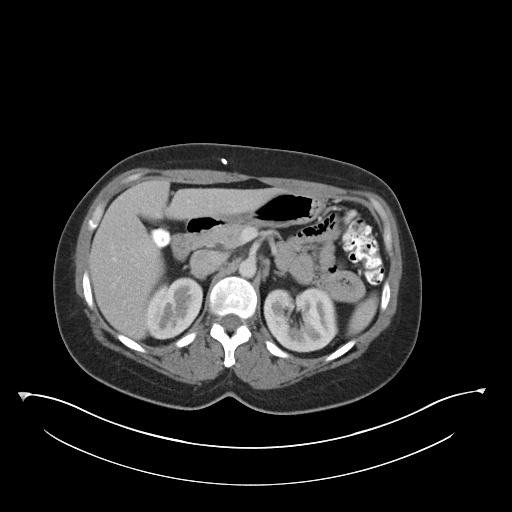
[im 79/99  soft-tissue]
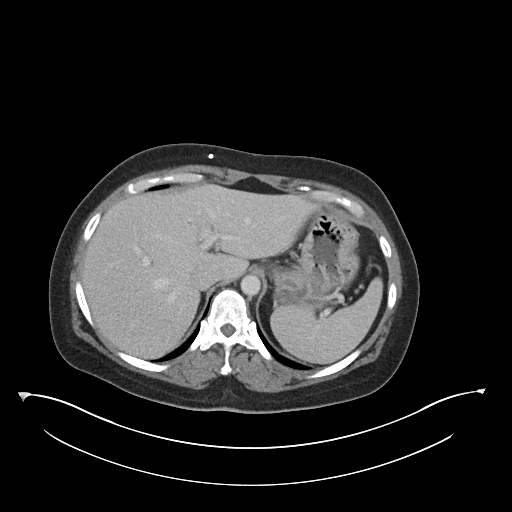
[im 84/99  soft-tissue]
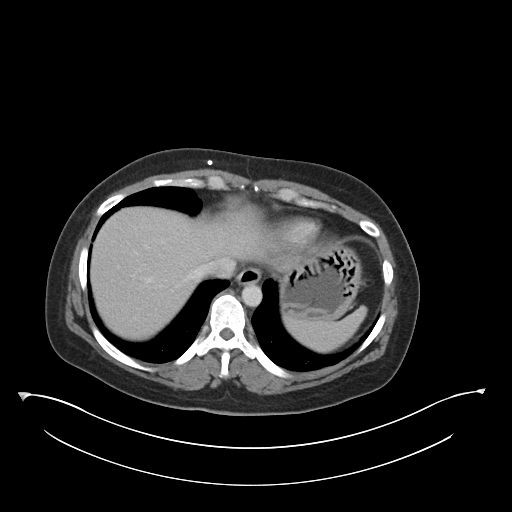
[im 94/99  soft-tissue]
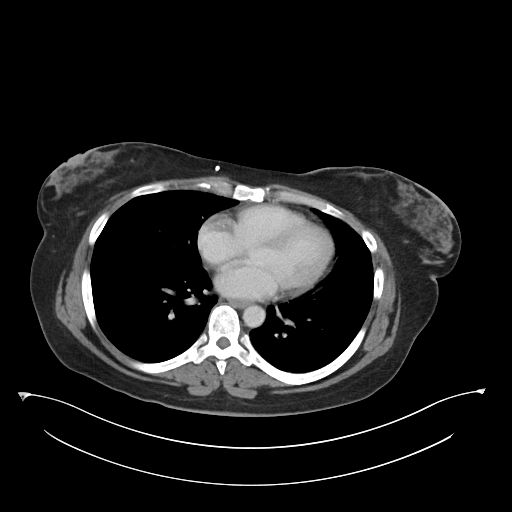

[Series 602: coronals · coronal · 0.96mm/px · 3 of 101 slices shown]
[im 34/101  soft-tissue]
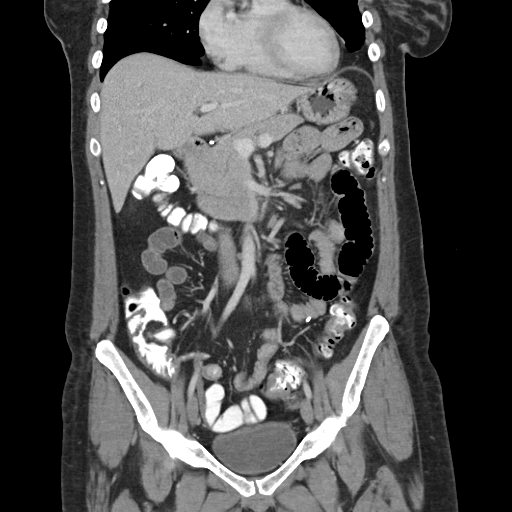
[im 45/101  soft-tissue]
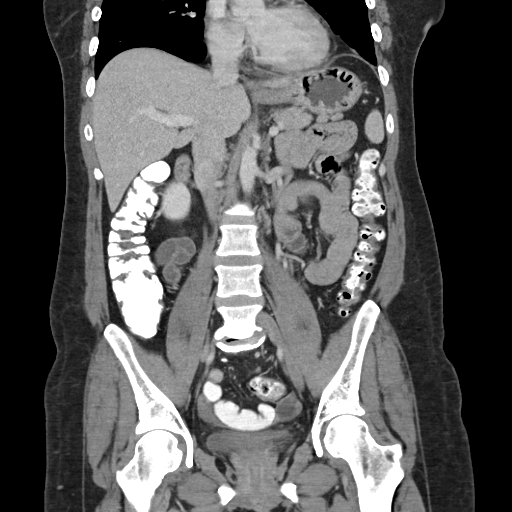
[im 56/101  soft-tissue]
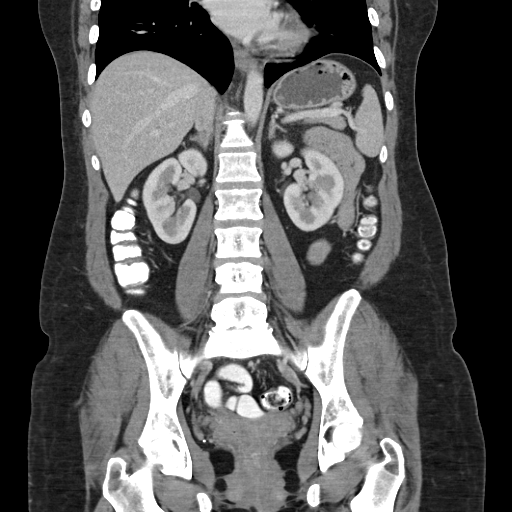

[16 of 46 positions shown; findings below may reference images not displayed]

FINDINGS: There is dependent atelectasis in the lung bases posteriorly. Lung
bases are otherwise clear.

There is a 5 mm cyst in the medial segment of the left lobe of the
liver. No other focal liver lesions are identified. Gallbladder is
absent. There is no appreciable biliary duct dilatation.

Spleen, pancreas, and adrenals appear normal. Kidneys bilaterally
show no mass or hydronephrosis on either side. There is no renal or
ureteral calculus on either side.

There is a ventriculoperitoneal shunt catheter with the catheter tip
in the left lower quadrant. There is no fluid surrounding the
catheter.

In the pelvis, the urinary bladder is midline with normal wall
thickness. There is wall thickening in the distal sigmoid and rectal
regions. There is no evidence of diverticulitis. There is no pelvic
mass or pelvic fluid collection. A dominant follicle in the left
ovary is noted measuring 2.0 x 1.6 cm, an incidental finding. Uterus
is absent.

The appendix appears normal.

There is no bowel obstruction. No free air or portal venous air.
There is a small ventral hernia containing only fat.

There is no ascites, adenopathy, or abscess in the abdomen or
pelvis. There is no demonstrable abdominal aortic aneurysm. There is
marked disc degeneration with vacuum phenomenon at L5-S1. There are
no blastic or lytic bone lesions.
IMPRESSION: No mesenteric inflammation or bowel obstruction.  No abscess.

Wall thickening in the distal sigmoid colon and rectum. The
appearance in these areas is consistent with distal colitis and
proctitis. There is no surrounding mesenteric stranding or abscess.
No diverticulitis.

Appendix appears normal.

No renal or ureteral calculus.  No hydronephrosis.

Ventriculoperitoneal shunt catheter without surrounding fluid or
inflammation.

Uterus and gallbladder absent.

## 2016-03-30 ENCOUNTER — Emergency Department (HOSPITAL_COMMUNITY)
Admission: EM | Admit: 2016-03-30 | Discharge: 2016-03-30 | Disposition: A | Discharge status: Home / Self Care | Payer: Medicare Other | Attending: Emergency Medicine | Admitting: Emergency Medicine

## 2016-03-30 ENCOUNTER — Encounter (HOSPITAL_COMMUNITY): Payer: Self-pay | Admitting: *Deleted

## 2016-03-30 DIAGNOSIS — Z79899 Other long term (current) drug therapy: Secondary | ICD-10-CM | POA: Insufficient documentation

## 2016-03-30 DIAGNOSIS — B9789 Other viral agents as the cause of diseases classified elsewhere: Secondary | ICD-10-CM | POA: Diagnosis not present

## 2016-03-30 DIAGNOSIS — F17210 Nicotine dependence, cigarettes, uncomplicated: Secondary | ICD-10-CM | POA: Diagnosis not present

## 2016-03-30 DIAGNOSIS — N760 Acute vaginitis: Secondary | ICD-10-CM | POA: Insufficient documentation

## 2016-03-30 DIAGNOSIS — B9689 Other specified bacterial agents as the cause of diseases classified elsewhere: Secondary | ICD-10-CM

## 2016-03-30 DIAGNOSIS — Z91040 Latex allergy status: Secondary | ICD-10-CM | POA: Insufficient documentation

## 2016-03-30 DIAGNOSIS — N898 Other specified noninflammatory disorders of vagina: Secondary | ICD-10-CM | POA: Diagnosis present

## 2016-03-30 LAB — URINALYSIS, ROUTINE W REFLEX MICROSCOPIC
Bilirubin Urine: NEGATIVE
GLUCOSE, UA: NEGATIVE mg/dL
Hgb urine dipstick: NEGATIVE
Ketones, ur: NEGATIVE mg/dL
LEUKOCYTES UA: NEGATIVE
Nitrite: NEGATIVE
PH: 7.5 (ref 5.0–8.0)
Protein, ur: NEGATIVE mg/dL
SPECIFIC GRAVITY, URINE: 1.016 (ref 1.005–1.030)

## 2016-03-30 LAB — WET PREP, GENITAL
SPERM: NONE SEEN
Trich, Wet Prep: NONE SEEN
Yeast Wet Prep HPF POC: NONE SEEN

## 2016-03-30 LAB — PREGNANCY, URINE: PREG TEST UR: NEGATIVE

## 2016-03-30 MED ORDER — METRONIDAZOLE 500 MG PO TABS: 500.0000 mg | ORAL_TABLET | Freq: Two times a day (BID) | ORAL | 0 refills | Status: DC

## 2016-03-30 MED ORDER — FLUCONAZOLE 150 MG PO TABS: 150.0000 mg | ORAL_TABLET | Freq: Once | ORAL | 1 refills | Status: AC

## 2016-03-30 NOTE — ED Notes (Signed)
Discharge instructions, follow up care, and rx x2 reviewed with patient. Patient verbalized understanding. 

## 2016-03-30 NOTE — ED Triage Notes (Signed)
Pt complains of painful urination and vaginal irrigation for the past 3 days. Pt denies odorous discharge. Pt states she started wearing a "spanks" body shapers 1 week ago, which are tight panties that rise above the waste line.

## 2016-03-30 NOTE — ED Provider Notes (Signed)
[facility]-EMERGENCY DEPT Provider Note   CSN: [id number] Arrival date & time: 03/30/16  1324     History   Chief Complaint Chief Complaint  Patient presents with  . Dysuria  . Vaginal Itching    HPI Beth Kennedy is a 45 y.o. female.  This is a 45 year old patient who presents with vaginal itching. She has a three-day history of itching and irritation to her vaginal area. She also feels like she has pins and needles when she is urinating. She denies any vaginal discharge. She denies any bleeding. She is status post hysterectomy. She has some slight pain to her lower abdomen but is not sure it is from her hernia that she has more from the vaginal symptoms. She denies any nausea or vomiting. She's been using some diaper rash cream with some improvement in symptoms. She is sexually active with one female partner. She has a prior history of trichomonas several months ago.    Dysuria   Pertinent negatives include no chills, no nausea, no vomiting, no frequency, no hematuria and no flank pain.  Vaginal Itching  Pertinent negatives include no chest pain, no abdominal pain, no headaches and no shortness of breath.    Past Medical History:  Diagnosis Date  . Chronic back pain   . Depression   . Ectopic pregnancy   . Manic depression (HCC)     There are no active problems to display for this patient.   Past Surgical History:  Procedure Laterality Date  . ABDOMINAL HYSTERECTOMY    . BACK SURGERY    . BRAIN SURGERY    . CHOLECYSTECTOMY    . laproscopy    . VENTRICULOPERITONEAL SHUNT      OB History    No data available       Home Medications    Prior to Admission medications   Medication Sig Start Date End Date Taking? Authorizing Provider  gabapentin (NEURONTIN) 100 MG capsule Take 100 mg by mouth 2 (two) times daily as needed (back inflammation.).  09/16/14  Yes Historical Provider, MD  ibuprofen (ADVIL,MOTRIN) 800 MG tablet Take 1 tablet (800 mg total) by mouth 3  (three) times daily. 01/29/16  Yes Danelle Berry, PA-C  methocarbamol (ROBAXIN) 500 MG tablet Take 1 tablet (500 mg total) by mouth 2 (two) times daily. Patient taking differently: Take 500 mg by mouth 2 (two) times daily as needed for muscle spasms.  02/27/16  Yes Samantha Tripp Dowless, PA-C  Multiple Vitamin (MULTIVITAMIN) tablet Take 1 tablet by mouth daily.   Yes Historical Provider, MD  ondansetron (ZOFRAN) 4 MG tablet Take 1 tablet (4 mg total) by mouth every 6 (six) hours. 09/26/15  Yes Tiffany Neva Seat, PA-C  pantoprazole (PROTONIX) 40 MG tablet Take 40 mg by mouth daily as needed (acid reflux).    Yes Historical Provider, MD  zolpidem (AMBIEN) 10 MG tablet Take 10 mg by mouth at bedtime as needed for sleep.  06/30/14  Yes Historical Provider, MD  metroNIDAZOLE (FLAGYL) 500 MG tablet Take 1 tablet (500 mg total) by mouth 2 (two) times daily. One po bid x 7 days 03/30/16   Rolan Bucco, MD  oxyCODONE-acetaminophen (PERCOCET) 5-325 MG tablet Take 1-2 tablets by mouth every 8 (eight) hours as needed for severe pain. Patient not taking: Reported on 03/30/2016 01/29/16   Danelle Berry, PA-C  oxyCODONE-acetaminophen (PERCOCET/ROXICET) 5-325 MG tablet Take 2 tablets by mouth every 4 (four) hours as needed for severe pain. Patient not taking: Reported on 03/30/2016 02/27/16  Samantha Tripp Dowless, PA-C  traMADol (ULTRAM) 50 MG tablet Take 1 tablet (50 mg total) by mouth every 6 (six) hours as needed for moderate pain. Patient not taking: Reported on 03/30/2016 07/21/15   Fayrene HelperBowie Tran, PA-C    Family History No family history on file.  Social History Social History  Substance Use Topics  . Smoking status: Current Every Day Smoker    Packs/day: 0.25    Types: Cigarettes  . Smokeless tobacco: Never Used  . Alcohol use Yes     Comment: occasional     Allergies   Buprenorphine hcl; Morphine and related; Kiwi extract; Promethazine; Latex; Percocet [oxycodone-acetaminophen]; and Tape   Review of  Systems Review of Systems  Constitutional: Negative for chills, diaphoresis, fatigue and fever.  HENT: Negative for congestion, rhinorrhea and sneezing.   Eyes: Negative.   Respiratory: Negative for cough, chest tightness and shortness of breath.   Cardiovascular: Negative for chest pain and leg swelling.  Gastrointestinal: Negative for abdominal pain, blood in stool, diarrhea, nausea and vomiting.  Genitourinary: Positive for dysuria and vaginal pain. Negative for difficulty urinating, flank pain, frequency, hematuria, vaginal bleeding and vaginal discharge.  Musculoskeletal: Negative for arthralgias and back pain.  Skin: Negative for rash.  Neurological: Negative for dizziness, speech difficulty, weakness, numbness and headaches.     Physical Exam Updated Vital Signs BP 132/78 (BP Location: Left Arm)   Pulse 70   Temp 97.4 F (36.3 C)   Resp 18   Ht 5\' 7"  (1.702 m)   Wt 191 lb 9.6 oz (86.9 kg)   SpO2 100%   BMI 30.01 kg/m   Physical Exam  Constitutional: She is oriented to person, place, and time. She appears well-developed and well-nourished.  HENT:  Head: Normocephalic and atraumatic.  Eyes: Pupils are equal, round, and reactive to light.  Neck: Normal range of motion. Neck supple.  Cardiovascular: Normal rate, regular rhythm and normal heart sounds.   Pulmonary/Chest: Effort normal and breath sounds normal. No respiratory distress. She has no wheezes. She has no rales. She exhibits no tenderness.  Abdominal: Soft. Bowel sounds are normal. There is no tenderness. There is no rebound and no guarding.  Genitourinary:  Genitourinary Comments: Patient has moderate amount of white discharge. There is no cervical motion tenderness or adnexal tenderness. No ulcerations or other sores are noted.  Musculoskeletal: Normal range of motion. She exhibits no edema.  Lymphadenopathy:    She has no cervical adenopathy.  Neurological: She is alert and oriented to person, place, and time.   Skin: Skin is warm and dry. No rash noted.  Psychiatric: She has a normal mood and affect.     ED Treatments / Results  Labs (all labs ordered are listed, but only abnormal results are displayed) Labs Reviewed  WET PREP, GENITAL - Abnormal; Notable for the following:       Result Value   Clue Cells Wet Prep HPF POC PRESENT (*)    WBC, Wet Prep HPF POC MANY (*)    All other components within normal limits  URINALYSIS, ROUTINE W REFLEX MICROSCOPIC (NOT AT [facility])  PREGNANCY, URINE  RPR  HIV ANTIBODY (ROUTINE TESTING)  POC URINE PREG, ED  GC/CHLAMYDIA PROBE AMP ([place]) NOT AT [facility]    EKG  EKG Interpretation None       Radiology No results found.  Procedures Procedures (including critical care time)  Medications Ordered in ED Medications - No data to display   Initial Impression / Assessment and  Plan / ED Course  I have reviewed the triage vital signs and the nursing notes.  Pertinent labs & imaging results that were available during my care of the patient were reviewed by me and considered in my medical decision making (see chart for details).  Clinical Course    Patient has evidence of bacterial vaginosis. There is no evidence of a UTI. She was discharged home in good condition. She was started on Flagyl. She was encouraged to follow-up with an OB/GYN if her symptoms are not improving or return here as needed for any worsening symptoms.  Final Clinical Impressions(s) / ED Diagnoses   Final diagnoses:  BV (bacterial vaginosis)    New Prescriptions New Prescriptions   METRONIDAZOLE (FLAGYL) 500 MG TABLET    Take 1 tablet (500 mg total) by mouth 2 (two) times daily. One po bid x 7 days     Rolan Bucco, MD 03/30/16 1644

## 2016-03-31 LAB — HIV ANTIBODY (ROUTINE TESTING W REFLEX): HIV Screen 4th Generation wRfx: NONREACTIVE

## 2016-03-31 LAB — RPR: RPR Ser Ql: NONREACTIVE

## 2016-04-01 LAB — GC/CHLAMYDIA PROBE AMP (~~LOC~~) NOT AT ARMC
CHLAMYDIA, DNA PROBE: NEGATIVE
Neisseria Gonorrhea: NEGATIVE

## 2016-04-01 LAB — POC URINE PREG, ED: PREG TEST UR: NEGATIVE

## 2016-04-15 ENCOUNTER — Encounter (HOSPITAL_COMMUNITY): Payer: Self-pay | Admitting: *Deleted

## 2016-04-15 ENCOUNTER — Emergency Department (HOSPITAL_COMMUNITY)
Admission: EM | Admit: 2016-04-15 | Discharge: 2016-04-15 | Disposition: A | Discharge status: Home / Self Care | Payer: Medicare Other | Attending: Emergency Medicine | Admitting: Emergency Medicine

## 2016-04-15 DIAGNOSIS — F17210 Nicotine dependence, cigarettes, uncomplicated: Secondary | ICD-10-CM | POA: Diagnosis not present

## 2016-04-15 DIAGNOSIS — N760 Acute vaginitis: Secondary | ICD-10-CM | POA: Insufficient documentation

## 2016-04-15 DIAGNOSIS — B9689 Other specified bacterial agents as the cause of diseases classified elsewhere: Secondary | ICD-10-CM | POA: Diagnosis not present

## 2016-04-15 DIAGNOSIS — Z91040 Latex allergy status: Secondary | ICD-10-CM | POA: Insufficient documentation

## 2016-04-15 DIAGNOSIS — Z79899 Other long term (current) drug therapy: Secondary | ICD-10-CM | POA: Diagnosis not present

## 2016-04-15 DIAGNOSIS — N898 Other specified noninflammatory disorders of vagina: Secondary | ICD-10-CM | POA: Diagnosis present

## 2016-04-15 LAB — WET PREP, GENITAL
SPERM: NONE SEEN
TRICH WET PREP: NONE SEEN
Yeast Wet Prep HPF POC: NONE SEEN

## 2016-04-15 MED ORDER — FLUCONAZOLE 100 MG PO TABS
150.0000 mg | ORAL_TABLET | Freq: Once | ORAL | Status: AC
  Administered 2016-04-15: 150 mg via ORAL
  Filled 2016-04-15: qty 2

## 2016-04-15 MED ORDER — FLUCONAZOLE 150 MG PO TABS: 150.0000 mg | ORAL_TABLET | Freq: Every day | ORAL | 0 refills | Status: AC

## 2016-04-15 MED ORDER — CLINDAMYCIN HCL 300 MG PO CAPS: 300.0000 mg | ORAL_CAPSULE | Freq: Two times a day (BID) | ORAL | 0 refills | Status: AC

## 2016-04-15 NOTE — ED Notes (Signed)
Pt currently giving urine sample in RR11

## 2016-04-15 NOTE — Discharge Instructions (Signed)
Take Diflucan after last dose of clindamycin.  Follow-up with your GYN.

## 2016-04-15 NOTE — ED Notes (Signed)
Pt made aware of the plan of care. Pt verbalizes no needs at this time

## 2016-04-15 NOTE — ED Triage Notes (Signed)
Pt [facility] for bacterial infection and started on flagyl. Now has vaginal discharge and itching, thinks she has yeast infection.

## 2016-04-15 NOTE — ED Provider Notes (Signed)
[facility]EMER[facility]ENCY DEPT Provider Note   CSN: [id number] Arrival date & time: 04/15/16  1308     History   Chief Complaint Chief Complaint  Patient presents with  . Vaginal Discharge    HPI Beth Kennedy is a 45 y.o. female. Evaluation of vaginal discharge. She was treated with Flagyl for 7 days for BV. States her symptoms have persisted despite that. She states in the past she has gotten yeast candidiasis after Flagyl. No pain. No urinary symptoms.  HPI  Past Medical History:  Diagnosis Date  . Chronic back pain   . Depression   . Ectopic pregnancy   . Manic depression (HCC)     There are no active problems to display for this patient.   Past Surgical History:  Procedure Laterality Date  . ABDOMINAL HYSTERECTOMY    . BACK SURGERY    . BRAIN SURGERY    . CHOLECYSTECTOMY    . laproscopy    . VENTRICULOPERITONEAL SHUNT      OB History    No data available       Home Medications    Prior to Admission medications   Medication Sig Start Date End Date Taking? Authorizing Provider  clindamycin (CLEOCIN) 300 MG capsule Take 1 capsule (300 mg total) by mouth 2 (two) times daily. 04/15/16 04/22/16  Rolland PorterMark Josimar Corning, MD  fluconazole (DIFLUCAN) 150 MG tablet Take 1 tablet (150 mg total) by mouth daily. 04/15/16 04/22/16  Rolland PorterMark Adoni Greenough, MD  gabapentin (NEURONTIN) 100 MG capsule Take 100 mg by mouth 2 (two) times daily as needed (back inflammation.).  09/16/14   Historical Provider, MD  ibuprofen (ADVIL,MOTRIN) 800 MG tablet Take 1 tablet (800 mg total) by mouth 3 (three) times daily. 01/29/16   Danelle BerryLeisa Tapia, PA-C  methocarbamol (ROBAXIN) 500 MG tablet Take 1 tablet (500 mg total) by mouth 2 (two) times daily. Patient taking differently: Take 500 mg by mouth 2 (two) times daily as needed for muscle spasms.  02/27/16   Samantha Tripp Dowless, PA-C  metroNIDAZOLE (FLAGYL) 500 MG tablet Take 1 tablet (500 mg total) by mouth 2 (two) times daily. One po bid x 7 days 03/30/16   Rolan BuccoMelanie Belfi, MD    Multiple Vitamin (MULTIVITAMIN) tablet Take 1 tablet by mouth daily.    Historical Provider, MD  ondansetron (ZOFRAN) 4 MG tablet Take 1 tablet (4 mg total) by mouth every 6 (six) hours. 09/26/15   Tiffany Neva SeatGreene, PA-C  oxyCODONE-acetaminophen (PERCOCET) 5-325 MG tablet Take 1-2 tablets by mouth every 8 (eight) hours as needed for severe pain. Patient not taking: Reported on 03/30/2016 01/29/16   Danelle BerryLeisa Tapia, PA-C  oxyCODONE-acetaminophen (PERCOCET/ROXICET) 5-325 MG tablet Take 2 tablets by mouth every 4 (four) hours as needed for severe pain. Patient not taking: Reported on 03/30/2016 02/27/16   Samantha Tripp Dowless, PA-C  pantoprazole (PROTONIX) 40 MG tablet Take 40 mg by mouth daily as needed (acid reflux).     Historical Provider, MD  traMADol (ULTRAM) 50 MG tablet Take 1 tablet (50 mg total) by mouth every 6 (six) hours as needed for moderate pain. Patient not taking: Reported on 03/30/2016 07/21/15   Fayrene HelperBowie Tran, PA-C  zolpidem (AMBIEN) 10 MG tablet Take 10 mg by mouth at bedtime as needed for sleep.  06/30/14   Historical Provider, MD    Family History History reviewed. No pertinent family history.  Social History Social History  Substance Use Topics  . Smoking status: Current Every Day Smoker    Packs/day: 0.25  Types: Cigarettes  . Smokeless tobacco: Never Used  . Alcohol use Yes     Comment: occasional     Allergies   Buprenorphine hcl; Morphine and related; Kiwi extract; Promethazine; Latex; Percocet [oxycodone-acetaminophen]; and Tape   Review of Systems Review of Systems  Constitutional: Negative for appetite change, chills, diaphoresis, fatigue and fever.  HENT: Negative for mouth sores, sore throat and trouble swallowing.   Eyes: Negative for visual disturbance.  Respiratory: Negative for cough, chest tightness, shortness of breath and wheezing.   Cardiovascular: Negative for chest pain.  Gastrointestinal: Negative for abdominal distention, abdominal pain,  diarrhea, nausea and vomiting.  Endocrine: Negative for polydipsia, polyphagia and polyuria.  Genitourinary: Positive for vaginal discharge. Negative for dysuria, frequency and hematuria.  Musculoskeletal: Negative for gait problem.  Skin: Negative for color change, pallor and rash.  Neurological: Negative for dizziness, syncope, light-headedness and headaches.  Hematological: Does not bruise/bleed easily.  Psychiatric/Behavioral: Negative for behavioral problems and confusion.     Physical Exam Updated Vital Signs BP 109/72 (BP Location: Left Arm)   Pulse 73   Temp 98 F (36.7 C) (Oral)   Resp 17   SpO2 100%   Physical Exam  Constitutional: She is oriented to person, place, and time. She appears well-developed and well-nourished. No distress.  HENT:  Head: Normocephalic.  Eyes: Conjunctivae are normal. Pupils are equal, round, and reactive to light. No scleral icterus.  Neck: Normal range of motion. Neck supple. No thyromegaly present.  Cardiovascular: Normal rate and regular rhythm.  Exam reveals no gallop and no friction rub.   No murmur heard. Pulmonary/Chest: Effort normal and breath sounds normal. No respiratory distress. She has no wheezes. She has no rales.  Abdominal: Soft. Bowel sounds are normal. She exhibits no distension. There is no tenderness. There is no rebound.  Genitourinary: Vaginal discharge found.    Musculoskeletal: Normal range of motion.  Neurological: She is alert and oriented to person, place, and time.  Skin: Skin is warm and dry. No rash noted.  Psychiatric: She has a normal mood and affect. Her behavior is normal.     ED Treatments / Results  Labs (all labs ordered are listed, but only abnormal results are displayed) Labs Reviewed  WET PREP, GENITAL - Abnormal; Notable for the following:       Result Value   Clue Cells Wet Prep HPF POC PRESENT (*)    WBC, Wet Prep HPF POC FEW (*)    All other components within normal limits  GC/CHLAMYDIA  PROBE AMP ([place]) NOT AT [facility]    EKG  EKG Interpretation None       Radiology No results found.  Procedures Procedures (including critical care time)  Medications Ordered in ED Medications  fluconazole (DIFLUCAN) tablet 150 mg (150 mg Oral Given 04/15/16 1630)     Initial Impression / Assessment and Plan / ED Course  I have reviewed the triage vital signs and the nursing notes.  Pertinent labs & imaging results that were available during my care of the patient were reviewed by me and considered in my medical decision making (see chart for details).  Clinical Course     Exam consistent with yeast vaginitis. No yeast noted on exam. Persistence of clue cells. Plan will be clindamycin twice a day 7 days. Single dose of that [place] today. Second dose after treatment to prevent secondary or occult current Candida.  Final Clinical Impressions(s) / ED Diagnoses   Final diagnoses:  BV (bacterial vaginosis)  New Prescriptions Discharge Medication List as of 04/15/2016  4:23 PM    START taking these medications   Details  clindamycin (CLEOCIN) 300 MG capsule Take 1 capsule (300 mg total) by mouth 2 (two) times daily., Starting Mon 04/15/2016, Until Mon 04/22/2016, Print    fluconazole (DIFLUCAN) 150 MG tablet Take 1 tablet (150 mg total) by mouth daily., Starting Mon 04/15/2016, Until Mon 04/22/2016, Print         Rolland Porter, MD 04/15/16 [phone]

## 2016-04-15 NOTE — ED Notes (Signed)
ED Provider at bedside. 

## 2016-04-15 NOTE — ED Notes (Signed)
Pt in room already changed into gown. Pt gave urine sample which is currently in room with sticker filled out. Pt given warm blanket and waiting on provider.

## 2016-04-16 LAB — GC/CHLAMYDIA PROBE AMP (~~LOC~~) NOT AT ARMC
Chlamydia: NEGATIVE
Neisseria Gonorrhea: NEGATIVE

## 2016-06-09 ENCOUNTER — Emergency Department (HOSPITAL_COMMUNITY): Payer: Medicare Other

## 2016-06-09 ENCOUNTER — Emergency Department (HOSPITAL_COMMUNITY)
Admission: EM | Admit: 2016-06-09 | Discharge: 2016-06-09 | Disposition: A | Discharge status: Home / Self Care | Payer: Medicare Other | Attending: Emergency Medicine | Admitting: Emergency Medicine

## 2016-06-09 ENCOUNTER — Encounter (HOSPITAL_COMMUNITY): Payer: Self-pay | Admitting: *Deleted

## 2016-06-09 DIAGNOSIS — Z79899 Other long term (current) drug therapy: Secondary | ICD-10-CM | POA: Diagnosis not present

## 2016-06-09 DIAGNOSIS — F17210 Nicotine dependence, cigarettes, uncomplicated: Secondary | ICD-10-CM | POA: Insufficient documentation

## 2016-06-09 DIAGNOSIS — Z91040 Latex allergy status: Secondary | ICD-10-CM | POA: Diagnosis not present

## 2016-06-09 DIAGNOSIS — R519 Headache, unspecified: Secondary | ICD-10-CM

## 2016-06-09 DIAGNOSIS — R51 Headache: Secondary | ICD-10-CM | POA: Insufficient documentation

## 2016-06-09 LAB — BASIC METABOLIC PANEL
ANION GAP: 7 (ref 5–15)
BUN: 7 mg/dL (ref 6–20)
CALCIUM: 9.3 mg/dL (ref 8.9–10.3)
CO2: 23 mmol/L (ref 22–32)
Chloride: 106 mmol/L (ref 101–111)
Creatinine, Ser: 0.61 mg/dL (ref 0.44–1.00)
GFR calc non Af Amer: >60 mL/min (ref >60)
GLUCOSE: 96 mg/dL (ref 65–99)
POTASSIUM: 4.1 mmol/L (ref 3.5–5.1)
Sodium: 136 mmol/L (ref 135–145)

## 2016-06-09 LAB — CBC WITH DIFFERENTIAL/PLATELET
BASOS ABS: 0 10*3/uL (ref 0.0–0.1)
BASOS PCT: 0 %
Eosinophils Absolute: 0.1 10*3/uL (ref 0.0–0.7)
Eosinophils Relative: 1 %
HEMATOCRIT: 42.7 % (ref 36.0–46.0)
HEMOGLOBIN: 14.2 g/dL (ref 12.0–15.0)
LYMPHS PCT: 27 %
Lymphs Abs: 2.1 10*3/uL (ref 0.7–4.0)
MCH: 28.6 pg (ref 26.0–34.0)
MCHC: 33.3 g/dL (ref 30.0–36.0)
MCV: 86.1 fL (ref 78.0–100.0)
Monocytes Absolute: 0.4 10*3/uL (ref 0.1–1.0)
Monocytes Relative: 6 %
NEUTROS ABS: 5.2 10*3/uL (ref 1.7–7.7)
NEUTROS PCT: 66 %
Platelets: 254 10*3/uL (ref 150–400)
RBC: 4.96 MIL/uL (ref 3.87–5.11)
RDW: 13.1 % (ref 11.5–15.5)
WBC: 7.9 10*3/uL (ref 4.0–10.5)

## 2016-06-09 LAB — PREGNANCY, URINE: Preg Test, Ur: NEGATIVE

## 2016-06-09 MED ORDER — MAGNESIUM SULFATE 2 GM/50ML IV SOLN
2.0000 g | Freq: Once | INTRAVENOUS | Status: AC
  Administered 2016-06-09: 2 g via INTRAVENOUS
  Filled 2016-06-09: qty 50

## 2016-06-09 MED ORDER — DEXAMETHASONE SODIUM PHOSPHATE 10 MG/ML IJ SOLN
10.0000 mg | Freq: Once | INTRAMUSCULAR | Status: AC
  Administered 2016-06-09: 10 mg via INTRAVENOUS
  Filled 2016-06-09: qty 1

## 2016-06-09 MED ORDER — HALOPERIDOL LACTATE 5 MG/ML IJ SOLN
4.0000 mg | Freq: Once | INTRAMUSCULAR | Status: AC
  Administered 2016-06-09: 4 mg via INTRAVENOUS
  Filled 2016-06-09: qty 1

## 2016-06-09 MED ORDER — METOCLOPRAMIDE HCL 5 MG/ML IJ SOLN
10.0000 mg | Freq: Once | INTRAMUSCULAR | Status: AC
  Administered 2016-06-09: 10 mg via INTRAVENOUS
  Filled 2016-06-09: qty 2

## 2016-06-09 MED ORDER — DIPHENHYDRAMINE HCL 50 MG/ML IJ SOLN
25.0000 mg | Freq: Once | INTRAMUSCULAR | Status: AC
  Administered 2016-06-09: 25 mg via INTRAVENOUS
  Filled 2016-06-09: qty 1

## 2016-06-09 MED ORDER — KETOROLAC TROMETHAMINE 30 MG/ML IJ SOLN
30.0000 mg | Freq: Once | INTRAMUSCULAR | Status: AC
  Administered 2016-06-09: 30 mg via INTRAVENOUS
  Filled 2016-06-09: qty 1

## 2016-06-09 NOTE — ED Triage Notes (Signed)
Pt is tearful at triage, states shes been having headache x 2 days but severe today on right side only. Hx of shunt on right side, feels like she has something protruding from right side of neck.

## 2016-06-09 NOTE — Discharge Instructions (Signed)
Please follow up with Dr. Jordan LikesPool if you headache is not getting better

## 2016-06-09 NOTE — ED Provider Notes (Signed)
[facility] Provider Note   CSN: [id number] Arrival date & time: 06/09/16  1331     History   Chief Complaint Chief Complaint  Patient presents with  . Headache    HPI Beth Kennedy is a 45 y.o. female who presents with a headache. 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 She is followed by Dr. Jordan LikesPool with neurosurgery. She reports a headache over the past 2 days getting progressively worse today. It started on the right side but has spread to the frontal area. She states today she developed right sided facial numbness, blurry vision on the right side, nausea, vomiting, and right sided upper extremity weakness. She denies fevers. She has been taking Ibuprofen with good relief until today when headache became severe. She states she feels like her "brain is swollen" and that the right side of her neck is tight. She has been able to walk. No lightheadedness/dizziness, syncope.   HPI  Past Medical History:  Diagnosis Date  . Chronic back pain   . Depression   . Ectopic pregnancy   . Manic depression (HCC)     There are no active problems to display for this patient.   Past Surgical History:  Procedure Laterality Date  . ABDOMINAL HYSTERECTOMY    . BACK SURGERY    . BRAIN SURGERY    . CHOLECYSTECTOMY    . laproscopy    . VENTRICULOPERITONEAL SHUNT      OB History    No data available       Home Medications    Prior to Admission medications   Medication Sig Start Date End Date Taking? Authorizing Provider  gabapentin (NEURONTIN) 100 MG capsule Take 100 mg by mouth 2 (two) times daily as needed (back inflammation.).  09/16/14   Historical Provider, MD  ibuprofen (ADVIL,MOTRIN) 800 MG tablet Take 1 tablet (800 mg total) by mouth 3 (three) times daily. 01/29/16   Danelle BerryLeisa Tapia, PA-C  methocarbamol (ROBAXIN) 500 MG tablet Take 1 tablet (500 mg total) by mouth 2 (two) times daily. Patient taking  differently: Take 500 mg by mouth 2 (two) times daily as needed for muscle spasms.  02/27/16   Samantha Tripp Dowless, PA-C  metroNIDAZOLE (FLAGYL) 500 MG tablet Take 1 tablet (500 mg total) by mouth 2 (two) times daily. One po bid x 7 days 03/30/16   Rolan BuccoMelanie Belfi, MD  Multiple Vitamin (MULTIVITAMIN) tablet Take 1 tablet by mouth daily.    Historical Provider, MD  ondansetron (ZOFRAN) 4 MG tablet Take 1 tablet (4 mg total) by mouth every 6 (six) hours. 09/26/15   Tiffany Neva SeatGreene, PA-C  oxyCODONE-acetaminophen (PERCOCET) 5-325 MG tablet Take 1-2 tablets by mouth every 8 (eight) hours as needed for severe pain. Patient not taking: Reported on 03/30/2016 01/29/16   Danelle BerryLeisa Tapia, PA-C  oxyCODONE-acetaminophen (PERCOCET/ROXICET) 5-325 MG tablet Take 2 tablets by mouth every 4 (four) hours as needed for severe pain. Patient not taking: Reported on 03/30/2016 02/27/16   Samantha Tripp Dowless, PA-C  pantoprazole (PROTONIX) 40 MG tablet Take 40 mg by mouth daily as needed (acid reflux).     Historical Provider, MD  traMADol (ULTRAM) 50 MG tablet Take 1 tablet (50 mg total) by mouth every 6 (six) hours as needed for moderate pain. Patient not taking: Reported on 03/30/2016 07/21/15   Fayrene HelperBowie Tran, PA-C  zolpidem (AMBIEN) 10 MG tablet Take 10 mg by mouth at bedtime as needed for sleep.  06/30/14   Historical Provider, MD  Family History History reviewed. No pertinent family history.  Social History Social History  Substance Use Topics  . Smoking status: Current Every Day Smoker    Packs/day: 0.25    Types: Cigarettes  . Smokeless tobacco: Never Used  . Alcohol use Yes     Comment: occasional     Allergies   Buprenorphine hcl; Morphine and related; Kiwi extract; Promethazine; Latex; Percocet [oxycodone-acetaminophen]; and Tape   Review of Systems Review of Systems  Constitutional: Negative for fever.  Eyes: Positive for visual disturbance.  Musculoskeletal: Positive for neck pain and neck  stiffness. Negative for gait problem.  Neurological: Positive for weakness, numbness and headaches. Negative for dizziness, syncope and light-headedness.  All other systems reviewed and are negative.    Physical Exam Updated Vital Signs BP 146/92 (BP Location: Right Arm)   Pulse 89   Temp 98.4 F (36.9 C) (Oral)   Resp 16   Ht 5\' 7"  (1.702 m)   Wt 83.9 kg   SpO2 100%   BMI 28.98 kg/m   Physical Exam  Constitutional: She is oriented to person, place, and time. She appears well-developed and well-nourished. She appears distressed.  Crying and has neck flexed to right side. She is able to move it when asked  HENT:  Head: Normocephalic and atraumatic.  Eyes: Conjunctivae are normal. Pupils are equal, round, and reactive to light. Right eye exhibits no discharge. Left eye exhibits no discharge. No scleral icterus.  Neck: Normal range of motion. Neck supple.  No nuchal rigidity. Patient has significant tenderness along shunt, right SCM, and right temporal area  Cardiovascular: Normal rate.   Pulmonary/Chest: Effort normal. No respiratory distress.  Abdominal: She exhibits no distension.  Lymphadenopathy:    She has no cervical adenopathy.  Neurological: She is alert and oriented to person, place, and time.  Mental Status:  Alert, oriented, thought content appropriate, able to give a coherent history. Speech fluent without evidence of aphasia. Able to follow 2 step commands without difficulty.  Cranial Nerves:  II:  Peripheral visual fields grossly normal, pupils equal, round, reactive to light III,IV, VI: ptosis not present, extra-ocular motions intact bilaterally  V,VII: smile symmetric. Patient reports subjective decreased sensation to right side of face VIII: hearing grossly normal to voice  X: uvula elevates symmetrically  XI: bilateral shoulder shrug symmetric and strong XII: midline tongue extension without fassiculations Motor:  Normal tone. 4/5 in upper and lower  extremities on right side. 5/5 in upper and lower extremities on left side.  Sensory: Pinprick and light touch normal in all extremities.  Deep Tendon Reflexes: 2+ and symmetric in the biceps and patella Cerebellar: normal finger-to-nose with bilateral upper extremities Gait: normal gait and balance CV: distal pulses palpable throughout    Skin: Skin is warm and dry.  Psychiatric: She has a normal mood and affect. Her behavior is normal.  Nursing note and vitals reviewed.    ED Treatments / Results  Labs (all labs ordered are listed, but only abnormal results are displayed) Labs Reviewed  CBC WITH DIFFERENTIAL/PLATELET  BASIC METABOLIC PANEL  PREGNANCY, URINE    EKG  EKG Interpretation None       Radiology Ct Head Wo Contrast  Result Date: 06/09/2016 CLINICAL DATA:  Headache, nausea EXAM: CT HEAD WITHOUT CONTRAST TECHNIQUE: Contiguous axial images were obtained from the base of the skull through the vertex without intravenous contrast. COMPARISON:  09/25/2015 FINDINGS: Brain: Right parietal VP shunt remains in place, unchanged. No hydrocephalus. No acute intracranial  abnormality. Specifically, no hemorrhage, hydrocephalus, mass lesion, acute infarction, or significant intracranial injury. Vascular: No hyperdense vessel or unexpected calcification. Skull: No acute calvarial abnormality. Sinuses/Orbits: Visualized paranasal sinuses and mastoids clear. Orbital soft tissues unremarkable. Other: None IMPRESSION: Right ventricular shunt remains in stable position. No hydrocephalus. No acute intracranial abnormality. Electronically Signed   By: Charlett Nose M.D.   On: 06/09/2016 16:21    Procedures Procedures (including critical care time)  Medications Ordered in ED Medications  ketorolac (TORADOL) 30 MG/ML injection 30 mg (30 mg Intravenous Given 06/09/16 1551)  metoCLOPramide (REGLAN) injection 10 mg (10 mg Intravenous Given 06/09/16 1550)  dexamethasone (DECADRON) injection 10  mg (10 mg Intravenous Given 06/09/16 1551)  diphenhydrAMINE (BENADRYL) injection 25 mg (25 mg Intravenous Given 06/09/16 1550)  haloperidol lactate (HALDOL) injection 4 mg (4 mg Intravenous Given 06/09/16 1755)  magnesium sulfate IVPB 2 g 50 mL (0 g Intravenous Stopped 06/09/16 1824)     Initial Impression / Assessment and Plan / ED Course  I have reviewed the triage vital signs and the nursing notes.  Pertinent labs & imaging results that were available during my care of the patient were reviewed by me and considered in my medical decision making (see chart for details).  Clinical Course    45 year old female with headache. Patient is afebrile, not tachycardic or tachypneic, normotensive, and not hypoxic. Labs normal. CT head normal. Migraine cocktail given. Headache is not significantly better. Haldol given.   On recheck, patient is asking for d/c. Advised follow up with neuro. Patient is NAD, non-toxic, with stable VS. Patient is informed of clinical course, understands medical decision making process, and agrees with plan. Opportunity for questions provided and all questions answered. Return precautions given.   Final Clinical Impressions(s) / ED Diagnoses   Final diagnoses:  Nonintractable headache, unspecified chronicity pattern, unspecified headache type    New Prescriptions New Prescriptions   No medications on file     Bethel Born, PA-C 06/10/16 1604    Marily Memos, MD 06/11/16 [phone]

## 2016-06-09 NOTE — ED Notes (Signed)
EDP at bedside  

## 2016-06-09 NOTE — ED Notes (Signed)
Patient transported to CT 

## 2016-10-27 ENCOUNTER — Emergency Department (HOSPITAL_COMMUNITY)
Admission: EM | Admit: 2016-10-27 | Discharge: 2016-10-27 | Disposition: A | Discharge status: Home / Self Care | Payer: Medicare Other | Attending: Emergency Medicine | Admitting: Emergency Medicine

## 2016-10-27 ENCOUNTER — Emergency Department (HOSPITAL_COMMUNITY): Payer: Medicare Other

## 2016-10-27 ENCOUNTER — Encounter (HOSPITAL_COMMUNITY): Payer: Self-pay | Admitting: Emergency Medicine

## 2016-10-27 DIAGNOSIS — F439 Reaction to severe stress, unspecified: Secondary | ICD-10-CM

## 2016-10-27 DIAGNOSIS — F1210 Cannabis abuse, uncomplicated: Secondary | ICD-10-CM | POA: Diagnosis not present

## 2016-10-27 DIAGNOSIS — F1410 Cocaine abuse, uncomplicated: Secondary | ICD-10-CM

## 2016-10-27 DIAGNOSIS — Z91040 Latex allergy status: Secondary | ICD-10-CM | POA: Insufficient documentation

## 2016-10-27 DIAGNOSIS — Z79899 Other long term (current) drug therapy: Secondary | ICD-10-CM | POA: Insufficient documentation

## 2016-10-27 DIAGNOSIS — F17210 Nicotine dependence, cigarettes, uncomplicated: Secondary | ICD-10-CM | POA: Diagnosis not present

## 2016-10-27 DIAGNOSIS — F1110 Opioid abuse, uncomplicated: Secondary | ICD-10-CM

## 2016-10-27 DIAGNOSIS — F1910 Other psychoactive substance abuse, uncomplicated: Secondary | ICD-10-CM | POA: Diagnosis not present

## 2016-10-27 LAB — RAPID URINE DRUG SCREEN, HOSP PERFORMED
Amphetamines: NOT DETECTED
Barbiturates: NOT DETECTED
Benzodiazepines: NOT DETECTED
Cocaine: POSITIVE — AB
Opiates: POSITIVE — AB
Tetrahydrocannabinol: POSITIVE — AB

## 2016-10-27 LAB — ETHANOL: Alcohol, Ethyl (B): <5 mg/dL (ref <5)

## 2016-10-27 LAB — CBC
HEMATOCRIT: 43.8 % (ref 36.0–46.0)
Hemoglobin: 14.2 g/dL (ref 12.0–15.0)
MCH: 28.4 pg (ref 26.0–34.0)
MCHC: 32.4 g/dL (ref 30.0–36.0)
MCV: 87.6 fL (ref 78.0–100.0)
Platelets: 243 10*3/uL (ref 150–400)
RBC: 5 MIL/uL (ref 3.87–5.11)
RDW: 13.3 % (ref 11.5–15.5)
WBC: 11.4 10*3/uL — AB (ref 4.0–10.5)

## 2016-10-27 LAB — COMPREHENSIVE METABOLIC PANEL
ALT: 10 U/L — AB (ref 14–54)
AST: 16 U/L (ref 15–41)
Albumin: 4.2 g/dL (ref 3.5–5.0)
Alkaline Phosphatase: 56 U/L (ref 38–126)
Anion gap: 8 (ref 5–15)
BUN: 6 mg/dL (ref 6–20)
CHLORIDE: 106 mmol/L (ref 101–111)
CO2: 22 mmol/L (ref 22–32)
CREATININE: 0.64 mg/dL (ref 0.44–1.00)
Calcium: 9.2 mg/dL (ref 8.9–10.3)
GFR calc non Af Amer: >60 mL/min (ref >60)
Glucose, Bld: 101 mg/dL — ABNORMAL HIGH (ref 65–99)
POTASSIUM: 4 mmol/L (ref 3.5–5.1)
SODIUM: 136 mmol/L (ref 135–145)
Total Bilirubin: 0.9 mg/dL (ref 0.3–1.2)
Total Protein: 7.5 g/dL (ref 6.5–8.1)

## 2016-10-27 LAB — URINALYSIS, ROUTINE W REFLEX MICROSCOPIC
Bilirubin Urine: NEGATIVE
GLUCOSE, UA: NEGATIVE mg/dL
HGB URINE DIPSTICK: NEGATIVE
Ketones, ur: NEGATIVE mg/dL
LEUKOCYTES UA: NEGATIVE
Nitrite: NEGATIVE
PH: 6 (ref 5.0–8.0)
Protein, ur: NEGATIVE mg/dL
SPECIFIC GRAVITY, URINE: 1.01 (ref 1.005–1.030)

## 2016-10-27 LAB — LIPASE, BLOOD: Lipase: 19 U/L (ref 11–51)

## 2016-10-27 MED ORDER — HYDROXYZINE HCL 25 MG PO TABS: 25.0000 mg | ORAL_TABLET | Freq: Four times a day (QID) | ORAL | 0 refills | Status: DC | PRN

## 2016-10-27 MED ORDER — LORAZEPAM 1 MG PO TABS
1.0000 mg | ORAL_TABLET | Freq: Once | ORAL | Status: AC
  Administered 2016-10-27: 1 mg via ORAL
  Filled 2016-10-27: qty 1

## 2016-10-27 MED ORDER — ONDANSETRON 4 MG PO TBDP
8.0000 mg | ORAL_TABLET | Freq: Once | ORAL | Status: AC
  Administered 2016-10-27: 8 mg via ORAL
  Filled 2016-10-27: qty 2

## 2016-10-27 NOTE — ED Provider Notes (Signed)
[facility]EMERGENCY DEPT Provider Note   CSN: [id number] Arrival date & time: 10/27/16  [id number]     History   Chief Complaint Chief Complaint  Patient presents with  . Abdominal Pain  . Hernia  . Emesis  . Nausea    HPI Beth Kennedy is a 46 y.o. female.  She presents for evaluation of "stress," and abdominal pain.  She is having trouble managing her home situation with her 3 grandchildren that she watches, her daughter does not help with them.  She feels like her abdominal hernia has become a problem recently.  She has been nauseated and not eating much for several days.  She states that she has been drinking some alcohol and using some drugs.  She denies suicidal or homicidal ideation.  She took some leftover medications that she had but is not currently taking any regular psychiatric or other medications.  There are no other known modifying factors.  HPI  Past Medical History:  Diagnosis Date  . Chronic back pain   . Depression   . Ectopic pregnancy   . Manic depression (HCC)     There are no active problems to display for this patient.   Past Surgical History:  Procedure Laterality Date  . ABDOMINAL HYSTERECTOMY    . BACK SURGERY    . BRAIN SURGERY    . CHOLECYSTECTOMY    . laproscopy    . VENTRICULOPERITONEAL SHUNT      OB History    No data available       Home Medications    Prior to Admission medications   Medication Sig Start Date End Date Taking? Authorizing Provider  gabapentin (NEURONTIN) 100 MG capsule Take 100 mg by mouth 2 (two) times daily as needed (back inflammation.).  09/16/14  Yes [provider]  lamoTRIgine (LAMICTAL) 25 MG tablet Take 25 mg by mouth daily.   Yes [provider]  pantoprazole (PROTONIX) 40 MG tablet Take 40 mg by mouth daily as needed (acid reflux).    Yes [provider]  hydrOXYzine (ATARAX/VISTARIL) 25 MG tablet Take 1 tablet (25 mg total) by mouth every 6 (six) hours as needed for anxiety.  10/27/16   Mancel BaleWentz, Mylia Pondexter, MD    Family History No family history on file.  Social History Social History  Substance Use Topics  . Smoking status: Current Every Day Smoker    Packs/day: 0.25    Types: Cigarettes  . Smokeless tobacco: Never Used  . Alcohol use Yes     Comment: occasional     Allergies   Buprenorphine hcl; Morphine and related; Kiwi extract; Promethazine; Latex; Percocet [oxycodone-acetaminophen]; and Tape   Review of Systems Review of Systems  All other systems reviewed and are negative.    Physical Exam Updated Vital Signs BP 118/69   Pulse 65   Temp 98.9 F (37.2 C) (Oral)   Resp 20   Ht 5\' 7"  (1.702 m)   Wt 185 lb (83.9 kg)   SpO2 97%   BMI 28.98 kg/m   Physical Exam  Constitutional: She is oriented to person, place, and time. She appears well-developed and well-nourished. No distress (She is uncomfortable).  HENT:  Head: Normocephalic and atraumatic.  Eyes: Conjunctivae and EOM are normal. Pupils are equal, round, and reactive to light.  Neck: Normal range of motion and phonation normal. Neck supple.  Cardiovascular: Normal rate and regular rhythm.   Pulmonary/Chest: Effort normal and breath sounds normal. She exhibits no tenderness.  Abdominal: Soft. She  exhibits no distension and no mass. There is no tenderness (Bilateral lower quadrants, mild). There is no guarding.  Musculoskeletal: Normal range of motion.  Neurological: She is alert and oriented to person, place, and time. She exhibits normal muscle tone.  Skin: Skin is warm and dry.  Psychiatric: Her behavior is normal. Judgment and thought content normal.  Tearful, depressed  Nursing note and vitals reviewed.    ED Treatments / Results  Labs (all labs ordered are listed, but only abnormal results are displayed) Labs Reviewed  COMPREHENSIVE METABOLIC PANEL - Abnormal; Notable for the following:       Result Value   Glucose, Bld 101 (*)    ALT 10 (*)    All other components  within normal limits  CBC - Abnormal; Notable for the following:    WBC 11.4 (*)    All other components within normal limits  RAPID URINE DRUG SCREEN, HOSP PERFORMED - Abnormal; Notable for the following:    Opiates POSITIVE (*)    Cocaine POSITIVE (*)    Tetrahydrocannabinol POSITIVE (*)    All other components within normal limits  LIPASE, BLOOD  URINALYSIS, ROUTINE W REFLEX MICROSCOPIC  ETHANOL    EKG  EKG Interpretation None       Radiology Dg Abd Acute W/chest  Result Date: 10/27/2016 CLINICAL DATA:  Abdominal pain, vomiting X 3 days, Pt states a Hx of multiple surgeries: Cholecystectomy, hysterectomy,ectopic pregnancy, VP shunt, several spinal; States she has a left sided inguinal hernia which is very painful; diabetes EXAM: DG ABDOMEN ACUTE W/ 1V CHEST COMPARISON:  CT 09/25/2015 and previous FINDINGS: Heart size and mediastinal contours are within normal limits. Lungs are clear. No effusion. No free air. Normal bowel gas pattern. Bilateral pelvic phleboliths. Cholecystectomy clips. VP shunt extends to the pelvis, appears intact in its visualized segments. Regional bones unremarkable. IMPRESSION: No acute cardiopulmonary disease. VP shunt to the pelvis. Otherwise Negative abdominal radiographs. Electronically Signed   By: Corlis Leak M.D.   On: 10/27/2016 11:41    Procedures Procedures (including critical care time)  Medications Ordered in ED Medications  LORazepam (ATIVAN) tablet 1 mg (1 mg Oral Given 10/27/16 1208)  ondansetron (ZOFRAN-ODT) disintegrating tablet 8 mg (8 mg Oral Given 10/27/16 1205)     Initial Impression / Assessment and Plan / ED Course  I have reviewed the triage vital signs and the nursing notes.  Pertinent labs & imaging results that were available during my care of the patient were reviewed by me and considered in my medical decision making (see chart for details).      Patient Vitals for the past 24 hrs:  BP Temp Temp src Pulse Resp SpO2  Height Weight  10/27/16 1200 118/69 - - 65 - 97 % - -  10/27/16 1145 115/80 - - 61 - 97 % - -  10/27/16 1143 116/65 - - 68 20 98 % - -  10/27/16 1142 116/65 - - 79 - 100 % - -  10/27/16 0955 117/89 98.9 F (37.2 C) Oral 81 20 100 % 5\' 7"  (1.702 m) 185 lb (83.9 kg)    1:11 PM Reevaluation with update and discussion. After initial assessment and treatment, an updated evaluation reveals she is somewhat, at this time.  She requests "something for my nerves."  She agrees to follow-up as an outpatient.  Findings discussed with the patient and all questions answered. Kaylanie Capili L   Final Clinical Impressions(s) / ED Diagnoses   Final diagnoses:  Cocaine abuse  Marijuana abuse  Stress  Opiate abuse, episodic  Polysubstance abuse   Polysubstance abuse with stress.  Doubt acute unstable psychiatric condition.  Doubt serious bacterial infection or metabolic instability.  Nursing Notes Reviewed/ Care Coordinated Applicable Imaging Reviewed Interpretation of Laboratory Data incorporated into ED treatment  The patient appears reasonably screened and/or stabilized for discharge and I doubt any other medical condition or other [facility] requiring further screening, evaluation, or treatment in the ED at this time prior to discharge.  Plan: Home Medications-OTC as needed; Home Treatments-avoid illegal substances; return here if the recommended treatment, does not improve the symptoms; Recommended follow up-outpatient treatment with substance abuse center and medical doctor as needed   New Prescriptions Discharge Medication List as of 10/27/2016  1:14 PM    START taking these medications   Details  hydrOXYzine (ATARAX/VISTARIL) 25 MG tablet Take 1 tablet (25 mg total) by mouth every 6 (six) hours as needed for anxiety., Starting Sun 10/27/2016, Print         Mancel Bale, MD 10/27/16 [phone]

## 2016-10-27 NOTE — ED Notes (Signed)
Pt returns from radiology. 

## 2016-10-27 NOTE — ED Triage Notes (Signed)
Pt. Stated, I have a hernia and Ive been nauseated and unable to have a bowel movement for 3 days. Im also stressed.

## 2016-10-27 NOTE — Discharge Instructions (Signed)
It is important to avoid all illegal substances, to improve your ability to care for yourself and your grandchildren.  For stress management, consider seeing a therapist or talking to a supportive friend.  Follow-up for treatment, as recommended on the attached resource guide.

## 2017-03-12 IMAGING — CT CT HEAD W/O CM
2 series · 15 of 30 positions shown, 19 images · non-contrast
Comparison: 05/24/2015

CLINICAL DATA: Right-sided injury and numbness after a fall today.
Nausea. Headache. History of Chiari malformation.

EXAM:
CT HEAD WITHOUT CONTRAST
TECHNIQUE: Contiguous axial images were obtained from the base of the skull
through the vertex without intravenous contrast.

[Series 201: head w/o, idose (1) · axial · non-contrast · 0.39mm/px · z∈[+76,+196]mm · 13 of 30 slices shown, 17 images]
[im 3/30  brain]
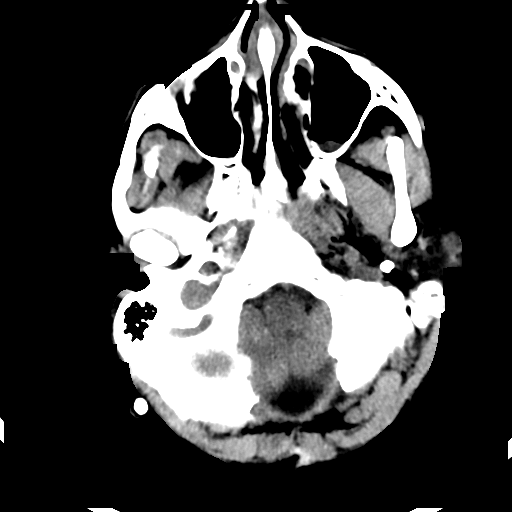
[im 3/30  bone]
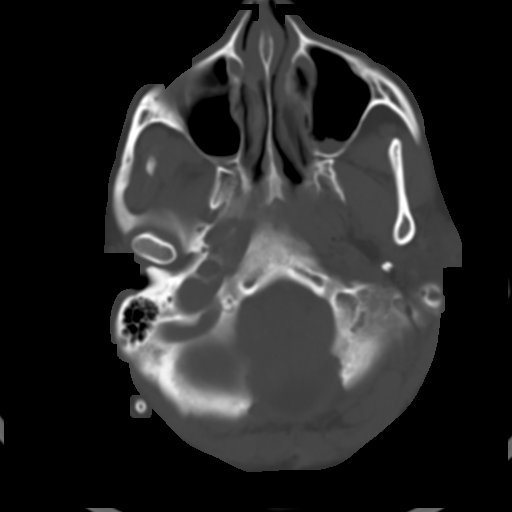
[im 5/30  brain]
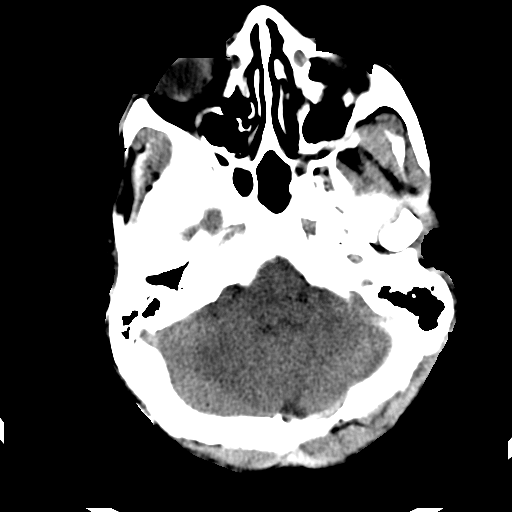
[im 7/30  brain]
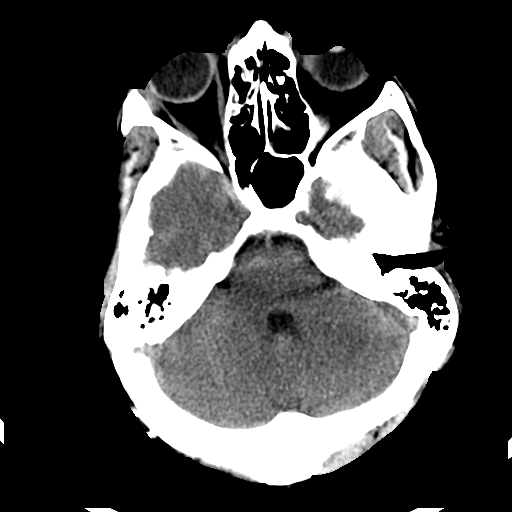
[im 9/30  brain]
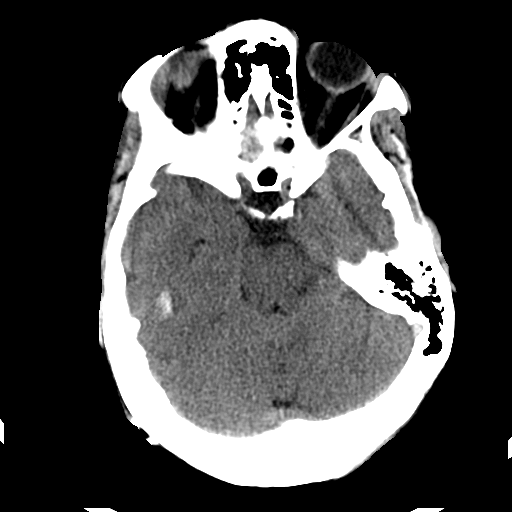
[im 11/30  brain]
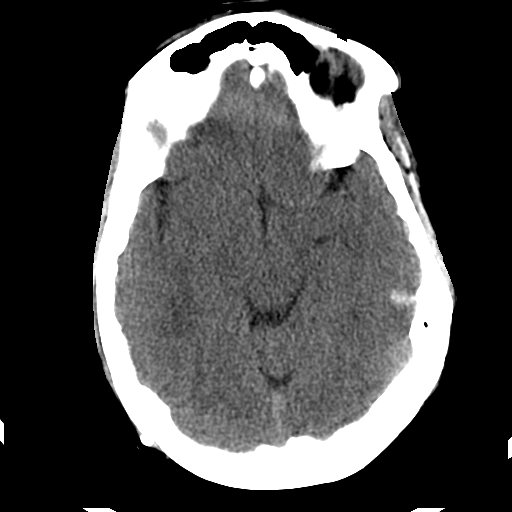
[im 11/30  bone]
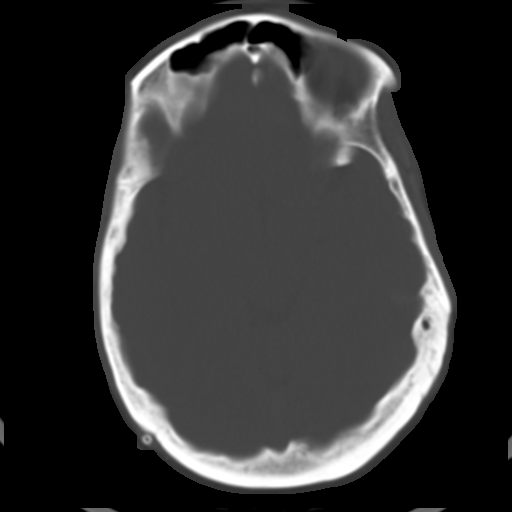
[im 13/30  brain]
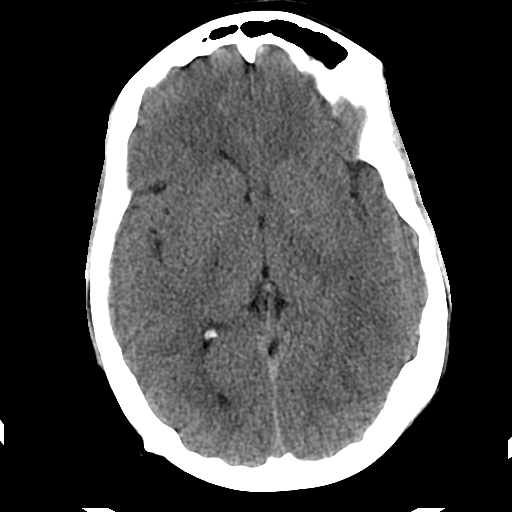
[im 15/30  brain]
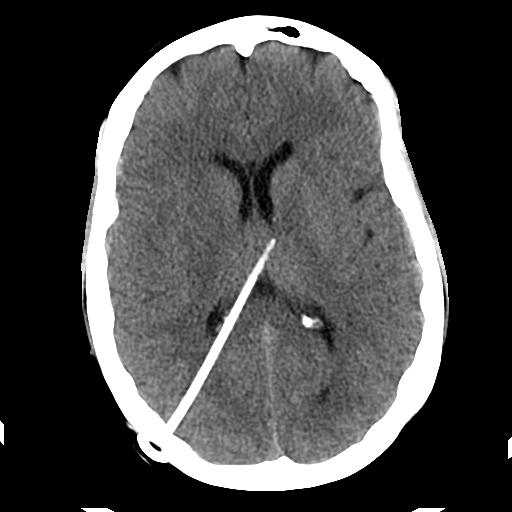
[im 17/30  brain]
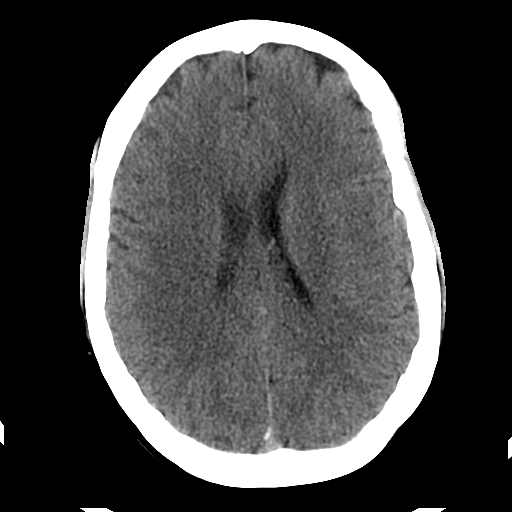
[im 19/30  brain]
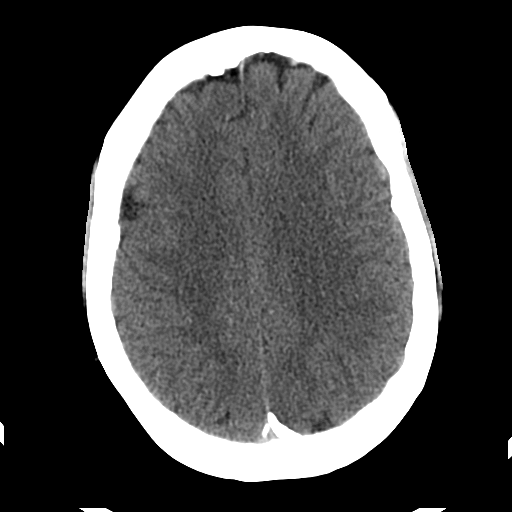
[im 19/30  bone]
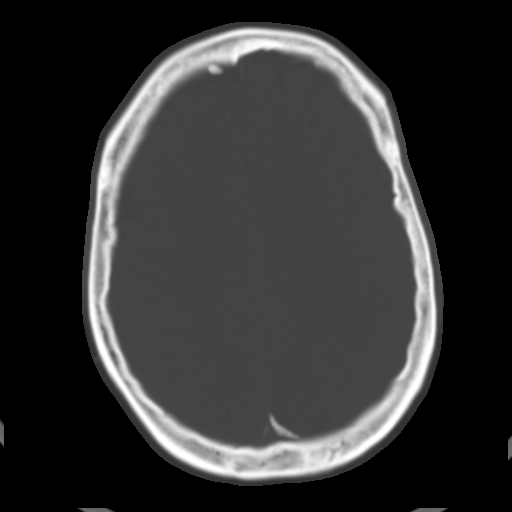
[im 21/30  brain]
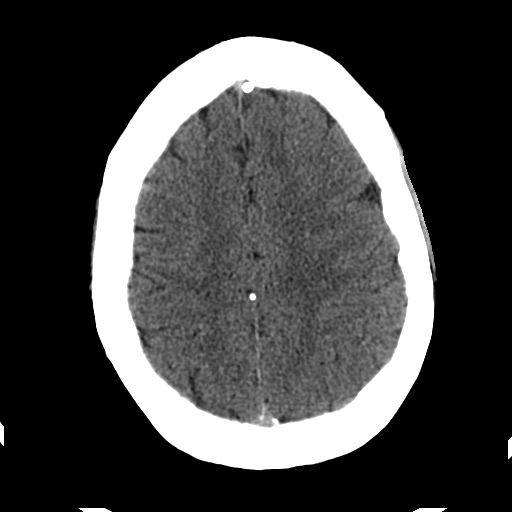
[im 23/30  brain]
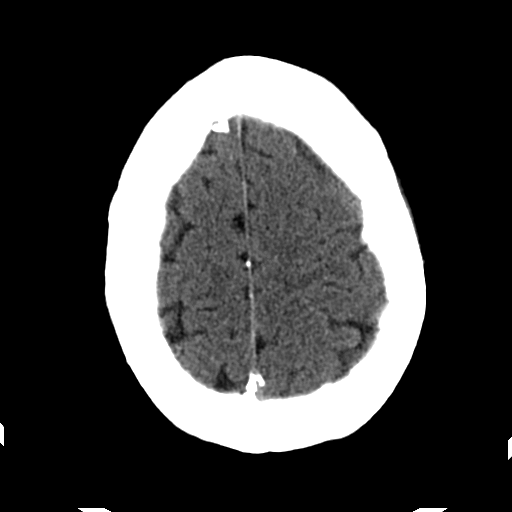
[im 25/30  brain]
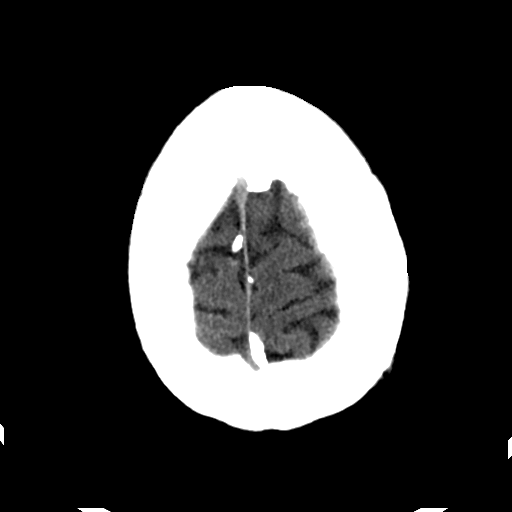
[im 27/30  brain]
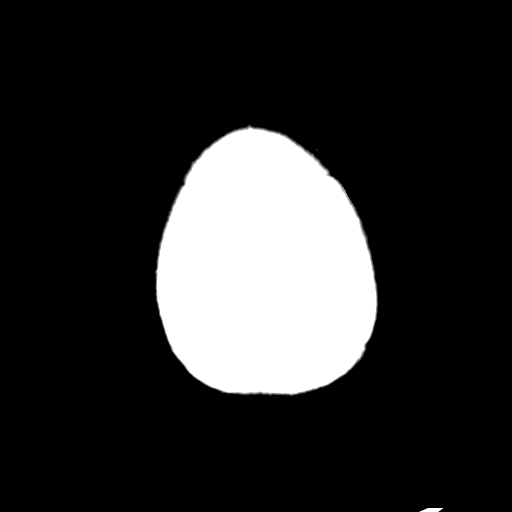
[im 27/30  bone]
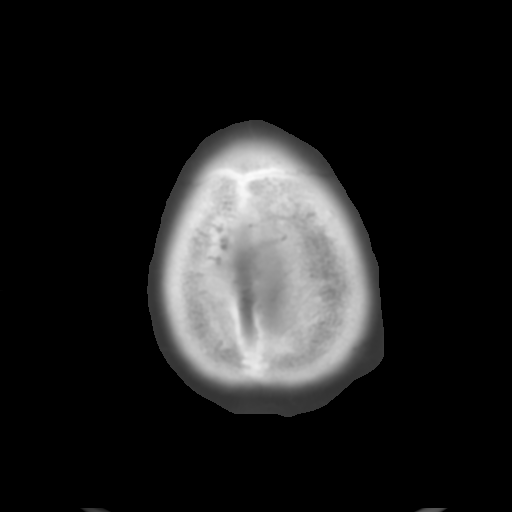

[Series 202: head w/o bone, idose (1) · axial · non-contrast · 0.39mm/px · z∈[+76,+96]mm · 2 of 30 slices shown]
[im 3/30  bone]
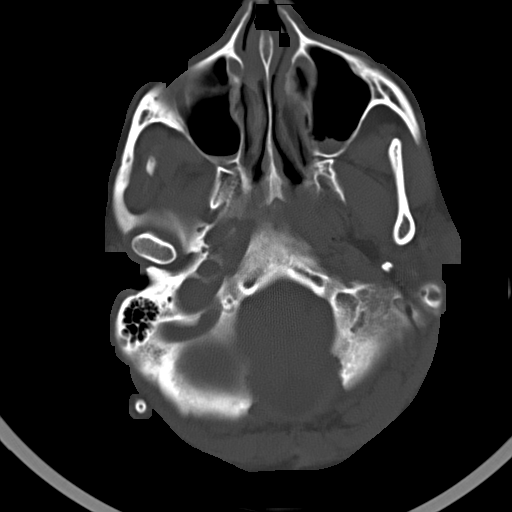
[im 7/30  bone]
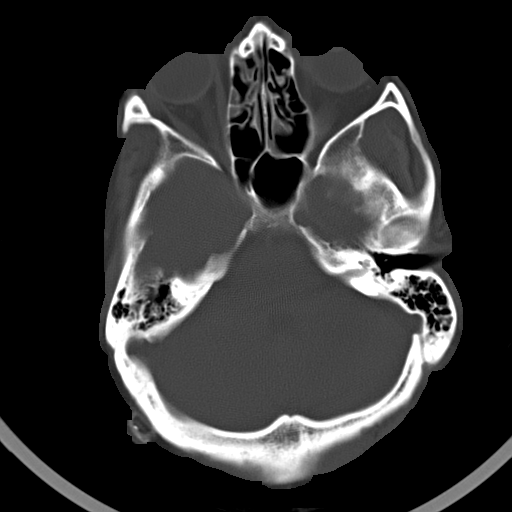

[15 of 30 positions shown; findings below may reference images not displayed]

FINDINGS: Postoperative changes with suboccipital craniostomy. Right trans
parietal ventricular shunt tube with tip in the left lateral
ventricle. Ventricles are decompressed. Cerebellar tonsillar
ectopia. No mass effect or midline shift. No abnormal extra-axial
fluid collections. Gray-white matter junctions are distinct. Basal
cisterns are not effaced. No acute intracranial hemorrhage. Small
fluid levels in the maxillary antra bilaterally. Mild mucosal
thickening in the paranasal sinuses. Mastoid air cells are not
opacified. No acute depressed skull fractures.
IMPRESSION: No acute intracranial abnormalities. Right ventricular shunt with
decompression of the ventricles. Cerebellar tonsillar ectopia with
suboccipital craniostomy.

## 2020-07-13 ENCOUNTER — Encounter: Payer: Self-pay | Admitting: *Deleted

## 2020-07-14 ENCOUNTER — Encounter: Payer: Self-pay | Admitting: Neurology

## 2020-07-14 ENCOUNTER — Ambulatory Visit (INDEPENDENT_AMBULATORY_CARE_PROVIDER_SITE_OTHER): Payer: 59 | Admitting: Neurology

## 2020-07-14 DIAGNOSIS — M5442 Lumbago with sciatica, left side: Secondary | ICD-10-CM | POA: Diagnosis not present

## 2020-07-14 DIAGNOSIS — M5441 Lumbago with sciatica, right side: Secondary | ICD-10-CM | POA: Diagnosis not present

## 2020-07-14 DIAGNOSIS — G8929 Other chronic pain: Secondary | ICD-10-CM

## 2020-07-14 DIAGNOSIS — G935 Compression of brain: Secondary | ICD-10-CM | POA: Diagnosis not present

## 2020-07-14 DIAGNOSIS — G4489 Other headache syndrome: Secondary | ICD-10-CM

## 2020-07-14 DIAGNOSIS — M545 Low back pain, unspecified: Secondary | ICD-10-CM

## 2020-07-14 HISTORY — DX: Other headache syndrome: G44.89

## 2020-07-14 HISTORY — DX: Low back pain, unspecified: M54.50

## 2020-07-14 HISTORY — DX: Compression of brain: G93.5

## 2020-07-14 HISTORY — DX: Other chronic pain: G89.29

## 2020-07-14 MED ORDER — BACLOFEN 10 MG PO TABS: ORAL_TABLET | ORAL | 3 refills | Status: DC

## 2020-07-14 MED ORDER — LUBIPROSTONE 8 MCG PO CAPS: 8.0000 ug | ORAL_CAPSULE | Freq: Two times a day (BID) | ORAL | Status: AC | PRN

## 2020-07-14 MED ORDER — DULOXETINE HCL 30 MG PO CPEP: ORAL_CAPSULE | ORAL | 3 refills | Status: DC

## 2020-07-14 NOTE — Progress Notes (Signed)
Reason for visit: Arnold-Chiari malformation, headache, chronic low back pain  Referring physician: Dr. Coolidge Breeze SABRIA FLORIDO is a 50 y.o. female  History of present illness:  Ms. 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  This was done around 2003, but patient had to have a revision due to a problem with meningitis, the revision was done in 2004.  The patient has been stable since that time.  The patient was having severe headaches in the back of the head prior to the VP shunt.  The patient is still having occasional headaches but on the top of the head, she also has a lot of sinus drainage issues.  She began having significant back pain and pain down the left leg in 2004, she was found to have evidence of a left S1 nerve root impingement and underwent surgery by Dr. Dutch Quint.  The patient has had a failed back syndrome, she has had a total of 4 back surgeries.  She reports that she is still having a lot of discomfort in the back with spasms and some chronic numbness going down the left leg to the foot.  She will have some discomfort down the right leg to the knee.  She is in physical therapy and doing regular stretching exercises.  She lives in [place], [place] but is planning on moving to this area in the near future.  She reports chronic constipation issues.  In the past she has had issues with abuse of opiates, cocaine, alcohol, and marijuana.  She reports some arm discomfort as well currently.  She has some decrease in hearing and vision.  She has some occasional neck stiffness.  She comes this office for further evaluation.  Past Medical History:  Diagnosis Date  . Angina at rest [facility])   . Chiari I malformation (HCC)   . Chronic back pain   . DDD (degenerative disc disease), cervical   . DDD (degenerative disc disease), lumbar   . Depression   . Diverticulitis   . Ectopic pregnancy   . GERD  (gastroesophageal reflux disease)   . Hx of migraines   . Manic depression (HCC)   . Meningitis    H/O    Past Surgical History:  Procedure Laterality Date  . ABDOMINAL HYSTERECTOMY    . ABDOMINAL HYSTERECTOMY  1990's   partial   . BACK SURGERY  2004-2014   x 4  . BRAIN SURGERY     x 3, VP shunt  . CHOLECYSTECTOMY    . CHOLECYSTECTOMY  1990's  . laproscopy    . VENTRICULOPERITONEAL SHUNT      Family History  Problem Relation Age of Onset  . Depression Mother   . Diabetes Mother   . Heart disease Mother   . Drug abuse Father   . Bipolar disorder Sister   . Mood Disorder Brother   . Schizophrenia Daughter     Social history:  reports that she has been smoking cigarettes. She has been smoking about 0.25 packs per day. She has never used smokeless tobacco. She reports current alcohol use. She reports current drug use. Drug: Marijuana.  Medications:  Prior to Admission medications   Medication Sig Start Date End Date Taking? Authorizing Provider  gabapentin (NEURONTIN) 100 MG capsule Take 100 mg by mouth 2 (two) times daily as needed (back inflammation.).  09/16/14   [provider]  hydrOXYzine (ATARAX/VISTARIL) 25 MG tablet Take 1  tablet (25 mg total) by mouth every 6 (six) hours as needed for anxiety. 10/27/16   Mancel Bale, MD  lamoTRIgine (LAMICTAL) 25 MG tablet Take 25 mg by mouth daily.    [provider]  pantoprazole (PROTONIX) 40 MG tablet Take 40 mg by mouth daily as needed (acid reflux).     [provider]      Allergies  Allergen Reactions  . Buprenorphine Hcl Itching and Nausea And Vomiting  . Morphine And Related Itching and Nausea And Vomiting  . Kiwi Extract Swelling    Lips  . Other Other (See Comments)    Paxil, Zoloft- diarrhea  . Promethazine Other (See Comments)    Excessive sleeping and lethargy  . Latex Itching and Rash  . Percocet [Oxycodone-Acetaminophen] Itching       . Tape Itching and Rash    Please use  "paper" tape.    ROS:  Out of a complete 14 system review of symptoms, the patient complains only of the following symptoms, and all other reviewed systems are negative.  Headache Chills Ringing in the ears Blurred vision Snoring Muscle cramps, aching muscles Allergies Memory loss, numbness, weakness Sleepiness, snoring  Blood pressure 111/72, pulse 78, height 5\' 7"  (1.702 m), weight 186 lb (84.4 kg).  Physical Exam  General: The patient is alert and cooperative at the time of the examination.  Eyes: Pupils are equal, round, and reactive to light. Discs are flat bilaterally.  Neck: The neck is supple, no carotid bruits are noted.  Respiratory: The respiratory examination is clear.  Cardiovascular: The cardiovascular examination reveals a regular rate and rhythm, no obvious murmurs or rubs are noted.  Neuromuscular: The patient is able to fully flex the low back if she does this slowly.  Skin: Extremities are without significant edema.  Neurologic Exam  Mental status: The patient is alert and oriented x 3 at the time of the examination. The patient has apparent normal recent and remote memory, with an apparently normal attention span and concentration ability.  Cranial nerves: Facial symmetry is present. There is decreased sensation to pinprick on the right face as compared to the left, the patient splits midline with vibration sensation on the forehead, decreased on the right. The strength of the facial muscles and the muscles to head turning and shoulder shrug are normal bilaterally. Speech is well enunciated, no aphasia or dysarthria is noted. Extraocular movements are full. Visual fields are full. The tongue is midline, and the patient has symmetric elevation of the soft palate. No obvious hearing deficits are noted.  Motor: The motor testing reveals 5 over 5 strength of all 4 extremities. Good symmetric motor tone is noted throughout.  Sensory: Sensory testing is  notable for decreased pinprick sensation on the left arm and legs compared to the right, decreased position sense in both feet, decreased vibration sensation on the left foot as compared to the right, symmetric in the hands. No evidence of extinction is noted.  Coordination: Cerebellar testing reveals good finger-nose-finger and heel-to-shin bilaterally.  Gait and station: Gait is associated with a slight limping gait on the left leg.  Tandem gait is slightly unsteady.  Romberg is negative.  Reflexes: Deep tendon reflexes are symmetric and normal with exception of decreased ankle jerk reflexes bilaterally. Toes are downgoing bilaterally.   CT head 06/09/16:  IMPRESSION: Right ventricular shunt remains in stable position. No hydrocephalus. No acute intracranial abnormality.  * CT scan images were reviewed online. I agree with the written  report.    Assessment/Plan:  1.  Arnold-Chiari malformation, status post VP shunt  2.  History of headache  3.  Chronic low back pain, failed back syndrome  The patient will be placed on Cymbalta for the headaches and low back pain working up to 30 mg twice daily.  She will be placed on baclofen taking 5 mg in the morning and 10 mg in the evening, she will call for any dose adjustments.  She will follow up here in 4 months.  She continues physical therapy.  Marlan Palau MD 07/14/2020 10:49 AM  [facility] [address] [place], [place] [address]  Phone [phone] Fax [phone]

## 2020-11-20 ENCOUNTER — Ambulatory Visit (INDEPENDENT_AMBULATORY_CARE_PROVIDER_SITE_OTHER): Payer: 59 | Admitting: Family Medicine

## 2020-11-20 ENCOUNTER — Encounter: Payer: Self-pay | Admitting: Family Medicine

## 2020-11-20 VITALS — BP 123/69 | HR 80 | Ht 67.0 in | Wt 192.4 lb

## 2020-11-20 DIAGNOSIS — M5442 Lumbago with sciatica, left side: Secondary | ICD-10-CM

## 2020-11-20 DIAGNOSIS — G8929 Other chronic pain: Secondary | ICD-10-CM

## 2020-11-20 DIAGNOSIS — G4489 Other headache syndrome: Secondary | ICD-10-CM | POA: Diagnosis not present

## 2020-11-20 DIAGNOSIS — G935 Compression of brain: Secondary | ICD-10-CM | POA: Diagnosis not present

## 2020-11-20 DIAGNOSIS — M5441 Lumbago with sciatica, right side: Secondary | ICD-10-CM | POA: Diagnosis not present

## 2020-11-20 MED ORDER — DULOXETINE HCL 30 MG PO CPEP: ORAL_CAPSULE | ORAL | 3 refills | Status: AC

## 2020-11-20 MED ORDER — BACLOFEN 10 MG PO TABS: ORAL_TABLET | ORAL | 3 refills | Status: AC

## 2020-11-20 NOTE — Patient Instructions (Signed)
Below is our plan:  We will continue duloxetine 30mg  twice daily and baclofen 5mg  in the morning and 10mg  in the evenings. You may consider splitting evening dose and take 5mg  at 6pm and 5mg  at bedtime if you feel that grogginess gets worse. Please continue PT for concerns of imbalance.   Please make sure you are staying well hydrated. I recommend 50-60 ounces daily. Well balanced diet and regular exercise encouraged. Consistent sleep schedule with 6-8 hours recommended.   Please continue follow up with care team as directed.   Follow up with me in 1 year, sooner if needed.   You may receive a survey regarding today's visit. I encourage you to leave honest feed back as I do use this information to improve patient care. Thank you for seeing me today!

## 2020-11-20 NOTE — Progress Notes (Signed)
I have read the note, and I agree with the clinical assessment and plan.  Beth Kennedy   

## 2020-11-20 NOTE — Progress Notes (Signed)
Chief Complaint  Patient presents with   Follow-up    Rm 1, alone. LB pain, pt states baclofen has helped. Pt states HA have been better. Sharp shooting pain where shunt was placed, when turing head.       HISTORY OF PRESENT ILLNESS: 11/20/20 ALL:  Beth Kennedy is a 50 y.o. female with history of Beth Kennedy- Chiari malformation with shunt placed in 2003 here today for follow up for headaches and back pain. Dr Anne Hahn started her on duloxetine 30mg  BID and baclofen 5mg  in am and 10mg  in the evening at consult 07/2020. She reports that this has helped back pain and headaches. She feels she is tolerating medications but does feel that she is a little more sleepy in the mornings. She reports that she has had sharp pains in the back of her head, always on the right side. It seems to be with turning her head to the left. She reports that these pains have decreased in frequency since last being seen. She has been seen by NS and imaging showed normal functioning of shunt. She has some trouble with balance from time to time. She denies falls. She does not use an assistive device. She continues to participate in PT and feels this is helpful. She does have some hearing loss. She is waiting to move to [facility] before establishing care with audiologist.    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  This was done around 2003, but patient had to have a revision due to a problem with meningitis, the revision was done in 2004.  The patient has been stable since that time.  The patient was having severe headaches in the back of the head prior to the VP shunt.  The patient is still having occasional headaches but on the top of the head, she also has a lot of sinus drainage issues.  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 2004.  The patient has had a failed back syndrome, she has had a total of 4 back surgeries.  She reports that she is still having a lot of discomfort in the back with spasms and some chronic numbness going down the left leg to the foot.  She will have some discomfort down the right leg to the knee.  She is in physical therapy and doing regular stretching exercises.  She lives in [place], 2005 but is planning on moving to this area in the near future.  She reports chronic constipation issues.  In the past she has had issues with abuse of opiates, cocaine, alcohol, and marijuana.  She reports some arm discomfort as well currently.  She has some decrease in hearing and vision.  She has some occasional neck stiffness.  She comes this office for further evaluation.     REVIEW OF SYSTEMS: Out of a complete 14 system review of symptoms, the patient complains only of the following symptoms, and all other reviewed systems are negative.    ALLERGIES: Allergies  Allergen Reactions   Buprenorphine Hcl Itching and Nausea And Vomiting   Morphine And Related Itching and Nausea And Vomiting   Kiwi Extract Swelling    Lips   Other Other (See Comments)    Paxil,  Zoloft- diarrhea   Promethazine Other (See Comments)    Excessive sleeping and lethargy   Latex Itching and Rash   Percocet [Oxycodone-Acetaminophen] Itching        Tape Itching and Rash    Please use "paper" tape.     HOME MEDICATIONS: Outpatient Medications Prior to Visit  Medication Sig Dispense Refill   baclofen (LIORESAL) 10 MG tablet 1/2 tablet in the morning and one at night 45 each 3   BISACODYL 5 MG EC tablet TAKE 4 TABLETS BY MOUTH 2 TIMES A WEEK     DULoxetine (CYMBALTA) 30 MG capsule One tablet daily for 2 weeks, then take one twice a day 60 capsule 3   LINZESS 290 MCG CAPS capsule Take 290 mcg by mouth daily.     lubiprostone (AMITIZA) 8 MCG capsule  Take 1 capsule (8 mcg total) by mouth 2 (two) times daily as needed for constipation.     pantoprazole (PROTONIX) 40 MG tablet Take 40 mg by mouth daily as needed (acid reflux).      promethazine (PHENERGAN) 12.5 MG tablet TAKE 1 TABLET BY MOUTH FIVE TIMES A DAY AS NEEDED FOR 30 DAYS     No facility-administered medications prior to visit.     PAST MEDICAL HISTORY: Past Medical History:  Diagnosis Date   Angina at rest [facility])    Arnold-Chiari malformation, type I (HCC) 07/14/2020   Chiari I malformation (HCC)    Chronic back pain    Chronic low back pain 07/14/2020   DDD (degenerative disc disease), cervical    DDD (degenerative disc disease), lumbar    Depression    Diverticulitis    Ectopic pregnancy    GERD (gastroesophageal reflux disease)    Headache syndrome 07/14/2020   Hx of migraines    Manic depression (HCC)    Meningitis    H/O     PAST SURGICAL HISTORY: Past Surgical History:  Procedure Laterality Date   ABDOMINAL HYSTERECTOMY     ABDOMINAL HYSTERECTOMY  1990's   partial    BACK SURGERY  2004-2014   x 4   BRAIN SURGERY     x 3, VP shunt   CHOLECYSTECTOMY     CHOLECYSTECTOMY  1990's   laproscopy     VENTRICULOPERITONEAL SHUNT       FAMILY HISTORY: Family History  Problem Relation Age of Onset   Depression Mother    Diabetes Mother    Heart disease Mother    Drug abuse Father    Bipolar disorder Sister    Mood Disorder Brother    Schizophrenia Daughter      SOCIAL HISTORY: Social History   Socioeconomic History   Marital status: Single    Spouse name: Not on file   Number of children: 2   Years of education: Not on file   Highest education level: Not on file  Occupational History    Comment: disability  Tobacco Use   Smoking status: Every Day    Packs/day: 0.25    Pack years: 0.00    Types: Cigarettes   Smokeless tobacco: Never  Substance and Sexual Activity   Alcohol use: Yes    Comment: occasional   Drug use: Yes    Types:  Marijuana   Sexual activity: Yes    Birth control/protection: None  Other Topics Concern   Not on file  Social History Narrative   No caffeine   Social Determinants of Corporate investment banker Strain: Not on file  Food  Insecurity: Not on file  Transportation Needs: Not on file  Physical Activity: Not on file  Stress: Not on file  Social Connections: Not on file  Intimate Partner Violence: Not on file      PHYSICAL EXAM  Vitals:   11/20/20 0923  BP: 123/69  Pulse: 80  Weight: 192 lb 6.4 oz (87.3 kg)  Height: 5\' 7"  (1.702 m)   Body mass index is 30.13 kg/m.   Generalized: Well developed, in no acute distress  Cardiology: normal rate and rhythm, no murmur auscultated  Respiratory: clear to auscultation bilaterally    Neurological examination  Mentation: Alert oriented to time, place, history taking. Follows all commands speech and language fluent Cranial nerve II-XII: Pupils were equal round reactive to light. Extraocular movements were full, visual field were full on confrontational test. Facial sensation and strength were normal. Head turning and shoulder shrug  were normal and symmetric. Motor: The motor testing reveals 5 over 5 strength of all 4 extremities. Good symmetric motor tone is noted throughout.  Gait and station: Gait is normal.     DIAGNOSTIC DATA (LABS, IMAGING, TESTING) - I reviewed patient records, labs, notes, testing and imaging myself where available.  Lab Results  Component Value Date   WBC 11.4 (H) 10/27/2016   HGB 14.2 10/27/2016   HCT 43.8 10/27/2016   MCV 87.6 10/27/2016   PLT 243 10/27/2016      Component Value Date/Time   NA 136 10/27/2016 1027   K 4.0 10/27/2016 1027   CL 106 10/27/2016 1027   CO2 22 10/27/2016 1027   GLUCOSE 101 (H) 10/27/2016 1027   BUN 6 10/27/2016 1027   CREATININE 0.64 10/27/2016 1027   CALCIUM 9.2 10/27/2016 1027   PROT 7.5 10/27/2016 1027   ALBUMIN 4.2 10/27/2016 1027   AST 16 10/27/2016 1027    ALT 10 (L) 10/27/2016 1027   ALKPHOS 56 10/27/2016 1027   BILITOT 0.9 10/27/2016 1027   GFRNONAA >60 10/27/2016 1027   GFRAA >60 10/27/2016 1027   No results found for: CHOL, HDL, LDLCALC, LDLDIRECT, TRIG, CHOLHDL No results found for: 10/29/2016 No results found for: VITAMINB12 No results found for: TSH  No flowsheet data found.   No flowsheet data found.   ASSESSMENT AND PLAN  50 y.o. year old female  has a past medical history of Angina at rest [facility]), Arnold-Chiari malformation, type I (HCC) (07/14/2020), Chiari I malformation (HCC), Chronic back pain, Chronic low back pain (07/14/2020), DDD (degenerative disc disease), cervical, DDD (degenerative disc disease), lumbar, Depression, Diverticulitis, Ectopic pregnancy, GERD (gastroesophageal reflux disease), Headache syndrome (07/14/2020), migraines, Manic depression (HCC), and Meningitis. here with   Arnold-Chiari malformation, type I (HCC)  Chronic bilateral low back pain with bilateral sciatica  Headache syndrome  Grayson reports improvement in back pain and headaches since starting duloxetine and baclofen. We will continue duloxetine 30mg  BID and baclofen 5mg  in the mornings anf 10mg  at night. She may consider splitting evening dose to take 5mg  at 6pm and 5mg  at bedtime if morning grogginess worsens. Healthy lifestyle habits encouraged. She will follow up in 1 year, sooner if needed.   No orders of the defined types were placed in this encounter.    No orders of the defined types were placed in this encounter.     09/11/2020, MSN, FNP-C 11/20/2020, 9:32 AM  [facility] [address], [address] [place], [place] Dapper [phone]

## 2021-03-19 DIAGNOSIS — F17200 Nicotine dependence, unspecified, uncomplicated: Secondary | ICD-10-CM | POA: Insufficient documentation

## 2021-03-19 DIAGNOSIS — R7303 Prediabetes: Secondary | ICD-10-CM | POA: Insufficient documentation

## 2021-03-19 DIAGNOSIS — J302 Other seasonal allergic rhinitis: Secondary | ICD-10-CM | POA: Insufficient documentation

## 2021-11-13 ENCOUNTER — Ambulatory Visit: Payer: Medicare Other | Admitting: Family Medicine

## 2021-11-13 ENCOUNTER — Encounter: Payer: Self-pay | Admitting: Family Medicine

## 2021-11-21 ENCOUNTER — Ambulatory Visit: Payer: 59 | Admitting: Family Medicine

## 2022-09-26 DIAGNOSIS — Z7409 Other reduced mobility: Secondary | ICD-10-CM | POA: Insufficient documentation

## 2022-09-26 DIAGNOSIS — R531 Weakness: Secondary | ICD-10-CM | POA: Insufficient documentation

## 2022-09-26 DIAGNOSIS — R269 Unspecified abnormalities of gait and mobility: Secondary | ICD-10-CM | POA: Insufficient documentation

## 2022-09-26 DIAGNOSIS — R293 Abnormal posture: Secondary | ICD-10-CM | POA: Insufficient documentation

## 2022-10-07 ENCOUNTER — Emergency Department (HOSPITAL_BASED_OUTPATIENT_CLINIC_OR_DEPARTMENT_OTHER)
Admission: EM | Admit: 2022-10-07 | Discharge: 2022-10-07 | Disposition: A | Discharge status: Home / Self Care | Payer: 59 | Attending: Emergency Medicine | Admitting: Emergency Medicine

## 2022-10-07 ENCOUNTER — Emergency Department (HOSPITAL_BASED_OUTPATIENT_CLINIC_OR_DEPARTMENT_OTHER): Payer: 59

## 2022-10-07 ENCOUNTER — Other Ambulatory Visit: Payer: Self-pay

## 2022-10-07 ENCOUNTER — Encounter (HOSPITAL_BASED_OUTPATIENT_CLINIC_OR_DEPARTMENT_OTHER): Payer: Self-pay | Admitting: Emergency Medicine

## 2022-10-07 DIAGNOSIS — Z982 Presence of cerebrospinal fluid drainage device: Secondary | ICD-10-CM | POA: Insufficient documentation

## 2022-10-07 DIAGNOSIS — R1013 Epigastric pain: Secondary | ICD-10-CM | POA: Diagnosis not present

## 2022-10-07 DIAGNOSIS — Z91040 Latex allergy status: Secondary | ICD-10-CM | POA: Insufficient documentation

## 2022-10-07 LAB — COMPREHENSIVE METABOLIC PANEL
ALT: 12 U/L (ref 0–44)
AST: 18 U/L (ref 15–41)
Albumin: 3.3 g/dL — ABNORMAL LOW (ref 3.5–5.0)
Alkaline Phosphatase: 49 U/L (ref 38–126)
Anion gap: 6 (ref 5–15)
BUN: 12 mg/dL (ref 6–20)
CO2: 21 mmol/L — ABNORMAL LOW (ref 22–32)
Calcium: 8.4 mg/dL — ABNORMAL LOW (ref 8.9–10.3)
Chloride: 111 mmol/L (ref 98–111)
Creatinine, Ser: 0.73 mg/dL (ref 0.44–1.00)
GFR, Estimated: >60 mL/min (ref >60)
Glucose, Bld: 99 mg/dL (ref 70–99)
Potassium: 4 mmol/L (ref 3.5–5.1)
Sodium: 138 mmol/L (ref 135–145)
Total Bilirubin: 0.4 mg/dL (ref 0.3–1.2)
Total Protein: 6.1 g/dL — ABNORMAL LOW (ref 6.5–8.1)

## 2022-10-07 LAB — URINALYSIS, ROUTINE W REFLEX MICROSCOPIC
Bilirubin Urine: NEGATIVE
Glucose, UA: NEGATIVE mg/dL
Hgb urine dipstick: NEGATIVE
Ketones, ur: NEGATIVE mg/dL
Leukocytes,Ua: NEGATIVE
Nitrite: NEGATIVE
Protein, ur: NEGATIVE mg/dL
Specific Gravity, Urine: 1.02 (ref 1.005–1.030)
pH: 8 (ref 5.0–8.0)

## 2022-10-07 LAB — CBC
HCT: 41.9 % (ref 36.0–46.0)
Hemoglobin: 13.4 g/dL (ref 12.0–15.0)
MCH: 27.2 pg (ref 26.0–34.0)
MCHC: 32 g/dL (ref 30.0–36.0)
MCV: 85.2 fL (ref 80.0–100.0)
Platelets: 230 10*3/uL (ref 150–400)
RBC: 4.92 MIL/uL (ref 3.87–5.11)
RDW: 13.1 % (ref 11.5–15.5)
WBC: 5.3 10*3/uL (ref 4.0–10.5)
nRBC: 0 % (ref 0.0–0.2)

## 2022-10-07 LAB — LIPASE, BLOOD: Lipase: 28 U/L (ref 11–51)

## 2022-10-07 NOTE — ED Notes (Signed)
Pt has multiple issues, upper abd pain when she beends over states it is where shunt is ,also states saw her neurologist a week ago and has had ongoing  pain , some sob   and dizziness for a year , pt is tearful

## 2022-10-07 NOTE — ED Provider Notes (Signed)
[place] [facility] AT [facility] Provider Note   CSN: [id number] Arrival date & time: 10/07/22  4098     History  Chief Complaint  Patient presents with   Abdominal Pain    epigastric    Beth Kennedy is a 52 y.o. female.  52 year old female with a history of a Chiari malformation status post VP shunt that has been complicated by meningitis in the past who presents emergency department with discomfort near her VP shunt tubing.  Says that over the past 2 weeks has had some discomfort along her anterior chest wall where her VP shunt tubing is.  Says that it gets worse with certain movements.  No fevers.  Has headaches at baseline but no new headaches.  No nausea or vomiting.  No abdominal pain otherwise.  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       Home Medications Prior to Admission medications   Medication Sig Start Date End Date Taking? Authorizing Provider  baclofen (LIORESAL) 10 MG tablet 1/2 tablet in the morning and one at night 11/20/20   Lomax, Amy, NP  BISACODYL 5 MG EC tablet TAKE 4 TABLETS BY MOUTH 2 TIMES A WEEK 07/06/20   [provider]  DULoxetine (CYMBALTA) 30 MG capsule One tablet daily for 2 weeks, then take one twice a day 11/20/20   Lomax, Amy, NP  LINZESS 290 MCG CAPS capsule Take 290 mcg by mouth daily. 07/12/20   [provider]  lubiprostone (AMITIZA) 8 MCG capsule Take 1 capsule (8 mcg total) by mouth 2 (two) times daily as needed for constipation. 07/14/20   York Spaniel, MD  pantoprazole (PROTONIX) 40 MG tablet Take 40 mg by mouth daily as needed (acid reflux).     [provider]  promethazine (PHENERGAN) 12.5 MG tablet TAKE 1 TABLET BY MOUTH FIVE TIMES A DAY AS NEEDED FOR 30 DAYS 06/15/20   [provider]      Allergies    Buprenorphine hcl, Morphine and related, Kiwi extract, Other,  Promethazine, Latex, Percocet [oxycodone-acetaminophen], and Tape    Review of Systems   Review of Systems  Physical Exam Updated Vital Signs BP 101/73 (BP Location: Left Arm)   Pulse 75   Temp 98.2 F (36.8 C) (Oral)   Resp 18   Wt 88.5 kg   SpO2 97%   BMI 30.54 kg/m  Physical Exam Vitals and nursing note reviewed.  Constitutional:      General: She is not in acute distress.    Appearance: She is well-developed.  HENT:     Head: Normocephalic and atraumatic.     Comments: The patient reservoir appears to be compressible    Right Ear: External ear normal.     Left Ear: External ear normal.     Nose: Nose normal.  Eyes:     Extraocular Movements: Extraocular movements intact.     Conjunctiva/sclera: Conjunctivae normal.     Pupils: Pupils are equal, round, and reactive to light.  Neck:     Comments: Negative Kernig and Brudzinski sign Cardiovascular:     Rate and Rhythm: Normal rate and regular rhythm.     Heart sounds: No murmur heard. Pulmonary:     Effort: Pulmonary effort is normal. No respiratory distress.     Breath sounds: Normal breath sounds.  Abdominal:     General: Abdomen is flat. There  is no distension.     Palpations: Abdomen is soft. There is no mass.     Tenderness: There is no abdominal tenderness. There is no guarding.     Comments: No significant tenderness to palpation over the entry site of the VP shunt into the abdomen.  No redness noted over VP shunt entry into the abdomen.  Musculoskeletal:     Cervical back: Normal range of motion and neck supple.     Right lower leg: No edema.     Left lower leg: No edema.  Skin:    General: Skin is warm and dry.  Neurological:     General: No focal deficit present.     Mental Status: She is alert and oriented to person, place, and time. Mental status is at baseline.     Cranial Nerves: No cranial nerve deficit.     Sensory: No sensory deficit.     Motor: No weakness.     Comments: Pupils 4 mm  bilaterally  Psychiatric:        Mood and Affect: Mood normal.     ED Results / Procedures / Treatments   Labs (all labs ordered are listed, but only abnormal results are displayed) Labs Reviewed  COMPREHENSIVE METABOLIC PANEL - Abnormal; Notable for the following components:      Result Value   CO2 21 (*)    Calcium 8.4 (*)    Total Protein 6.1 (*)    Albumin 3.3 (*)    All other components within normal limits  URINALYSIS, ROUTINE W REFLEX MICROSCOPIC - Abnormal; Notable for the following components:   Color, Urine STRAW (*)    All other components within normal limits  LIPASE, BLOOD  CBC    EKG EKG Interpretation  Date/Time:  Monday October 07 2022 10:05:53 EDT Ventricular Rate:  75 PR Interval:  155 QRS Duration: 91 QT Interval:  357 QTC Calculation: 399 R Axis:   55 Text Interpretation: Sinus rhythm Probable left atrial enlargement Low voltage, precordial leads Confirmed by Vonita Moss [phone]) on 10/07/2022 10:25:56 AM  Radiology DG Cervical Spine 1 View  Result Date: 10/07/2022 CLINICAL DATA:  Shunt malfunction. EXAM: DG CERVICAL SPINE - 1 VIEW; ABDOMEN - 1 VIEW; SKULL - 2 VIEW; CHEST 1 VIEW COMPARISON:  Abdominal x-ray 2018. MRI brain April 2023. Multiple other comparison examinations over several years. FINDINGS: Skull: No displaced or depressed skull fracture. Visualized paranasal sinuses are clear. There is a right-sided shunt entering the parietal region with tip extending anterior, medial and slightly superior. If needed additional workup with CT as clinically directed. Gap along the course of the tubing in the right side of the head consistent with a area of reservoir. Otherwise tubing extends along the right side head more caudal. Neck: Catheter tubing extends along the course of the right neck. There are areas of presumed calcification along the course of the catheter in the neck diffusely greatest towards the thoracic inlet. Mild degenerative changes seen of  the spine. There are some areas of calcification suggested along the left side of the neck. Chest: No consolidation, pneumothorax or effusion. Normal cardiopericardial silhouette without edema. Catheter tubing running vertically along the right side of the thorax. Once again there are areas of presumed calcification along the course of the catheter particularly along the upper portion of the chest. No discontinuity in the chest. Abdomen: Gas seen in nondilated loops large bowel with some mild stool. Minimal small bowel gas. Air in the stomach. Catheter tubing extends  vertically along the right hemithorax with tip crossing to the left the level of the mid pelvis. No areas of discontinuity. Catheter tip location is similar to that of the study of 2018. IMPRESSION: VP shunt in place. No areas of discontinuity along the course of the catheter. There are areas of calcification along the course of the catheter in the neck and upper chest. Tip extends into the left hemipelvis in a similar location to that of the x-ray from 2018. Please correlate with history. Nonspecific bowel gas pattern with scattered stool. No acute cardiopulmonary disease. Electronically Signed   By: Karen Kays M.D.   On: 10/07/2022 11:49   DG Skull 1-3 Views  Result Date: 10/07/2022 CLINICAL DATA:  Shunt malfunction. EXAM: DG CERVICAL SPINE - 1 VIEW; ABDOMEN - 1 VIEW; SKULL - 2 VIEW; CHEST 1 VIEW COMPARISON:  Abdominal x-ray 2018. MRI brain April 2023. Multiple other comparison examinations over several years. FINDINGS: Skull: No displaced or depressed skull fracture. Visualized paranasal sinuses are clear. There is a right-sided shunt entering the parietal region with tip extending anterior, medial and slightly superior. If needed additional workup with CT as clinically directed. Gap along the course of the tubing in the right side of the head consistent with a area of reservoir. Otherwise tubing extends along the right side head more caudal.  Neck: Catheter tubing extends along the course of the right neck. There are areas of presumed calcification along the course of the catheter in the neck diffusely greatest towards the thoracic inlet. Mild degenerative changes seen of the spine. There are some areas of calcification suggested along the left side of the neck. Chest: No consolidation, pneumothorax or effusion. Normal cardiopericardial silhouette without edema. Catheter tubing running vertically along the right side of the thorax. Once again there are areas of presumed calcification along the course of the catheter particularly along the upper portion of the chest. No discontinuity in the chest. Abdomen: Gas seen in nondilated loops large bowel with some mild stool. Minimal small bowel gas. Air in the stomach. Catheter tubing extends vertically along the right hemithorax with tip crossing to the left the level of the mid pelvis. No areas of discontinuity. Catheter tip location is similar to that of the study of 2018. IMPRESSION: VP shunt in place. No areas of discontinuity along the course of the catheter. There are areas of calcification along the course of the catheter in the neck and upper chest. Tip extends into the left hemipelvis in a similar location to that of the x-ray from 2018. Please correlate with history. Nonspecific bowel gas pattern with scattered stool. No acute cardiopulmonary disease. Electronically Signed   By: Karen Kays M.D.   On: 10/07/2022 11:49   DG Chest 1 View  Result Date: 10/07/2022 CLINICAL DATA:  Shunt malfunction. EXAM: DG CERVICAL SPINE - 1 VIEW; ABDOMEN - 1 VIEW; SKULL - 2 VIEW; CHEST 1 VIEW COMPARISON:  Abdominal x-ray 2018. MRI brain April 2023. Multiple other comparison examinations over several years. FINDINGS: Skull: No displaced or depressed skull fracture. Visualized paranasal sinuses are clear. There is a right-sided shunt entering the parietal region with tip extending anterior, medial and slightly  superior. If needed additional workup with CT as clinically directed. Gap along the course of the tubing in the right side of the head consistent with a area of reservoir. Otherwise tubing extends along the right side head more caudal. Neck: Catheter tubing extends along the course of the right neck. There are areas of  presumed calcification along the course of the catheter in the neck diffusely greatest towards the thoracic inlet. Mild degenerative changes seen of the spine. There are some areas of calcification suggested along the left side of the neck. Chest: No consolidation, pneumothorax or effusion. Normal cardiopericardial silhouette without edema. Catheter tubing running vertically along the right side of the thorax. Once again there are areas of presumed calcification along the course of the catheter particularly along the upper portion of the chest. No discontinuity in the chest. Abdomen: Gas seen in nondilated loops large bowel with some mild stool. Minimal small bowel gas. Air in the stomach. Catheter tubing extends vertically along the right hemithorax with tip crossing to the left the level of the mid pelvis. No areas of discontinuity. Catheter tip location is similar to that of the study of 2018. IMPRESSION: VP shunt in place. No areas of discontinuity along the course of the catheter. There are areas of calcification along the course of the catheter in the neck and upper chest. Tip extends into the left hemipelvis in a similar location to that of the x-ray from 2018. Please correlate with history. Nonspecific bowel gas pattern with scattered stool. No acute cardiopulmonary disease. Electronically Signed   By: Karen Kays M.D.   On: 10/07/2022 11:49   DG Abd 1 View  Result Date: 10/07/2022 CLINICAL DATA:  Shunt malfunction. EXAM: DG CERVICAL SPINE - 1 VIEW; ABDOMEN - 1 VIEW; SKULL - 2 VIEW; CHEST 1 VIEW COMPARISON:  Abdominal x-ray 2018. MRI brain April 2023. Multiple other comparison  examinations over several years. FINDINGS: Skull: No displaced or depressed skull fracture. Visualized paranasal sinuses are clear. There is a right-sided shunt entering the parietal region with tip extending anterior, medial and slightly superior. If needed additional workup with CT as clinically directed. Gap along the course of the tubing in the right side of the head consistent with a area of reservoir. Otherwise tubing extends along the right side head more caudal. Neck: Catheter tubing extends along the course of the right neck. There are areas of presumed calcification along the course of the catheter in the neck diffusely greatest towards the thoracic inlet. Mild degenerative changes seen of the spine. There are some areas of calcification suggested along the left side of the neck. Chest: No consolidation, pneumothorax or effusion. Normal cardiopericardial silhouette without edema. Catheter tubing running vertically along the right side of the thorax. Once again there are areas of presumed calcification along the course of the catheter particularly along the upper portion of the chest. No discontinuity in the chest. Abdomen: Gas seen in nondilated loops large bowel with some mild stool. Minimal small bowel gas. Air in the stomach. Catheter tubing extends vertically along the right hemithorax with tip crossing to the left the level of the mid pelvis. No areas of discontinuity. Catheter tip location is similar to that of the study of 2018. IMPRESSION: VP shunt in place. No areas of discontinuity along the course of the catheter. There are areas of calcification along the course of the catheter in the neck and upper chest. Tip extends into the left hemipelvis in a similar location to that of the x-ray from 2018. Please correlate with history. Nonspecific bowel gas pattern with scattered stool. No acute cardiopulmonary disease. Electronically Signed   By: Karen Kays M.D.   On: 10/07/2022 11:49     Procedures Procedures   Medications Ordered in ED Medications - No data to display  ED Course/ Medical Decision Making/  A&P                             Medical Decision Making Amount and/or Complexity of Data Reviewed Labs: ordered. Radiology: ordered.   Beth Kennedy is a 52 y.o. female with comorbidities that complicate the patient evaluation including Chiari malformation status post VP shunt who presents to the emergency department with epigastric discomfort near her VP shunt tubing  Initial Ddx:  Infection, fracture, hardware migration, hydrocephalus  MDM:  Unclear exactly what is causing patient's symptoms at this time.  May have had some hardware migration.  Infection is less likely given her lack of fever or headache or neck stiffness.  Will obtain patient series to ensure that there is no fracture of the hardware or other migration.  Not having any new neurologic symptoms that suggest pneumocephalus did not feel that head CT is warranted at this time especially due to the additional radiation risk and the fact that she will likely after receiving head CTs in her left eye.  Plan:  Labs Shunt series  ED Summary/Re-evaluation:  Patient underwent shunt series that did not show any abnormalities or fractures.  CBC with normal white count and patient was afebrile in the emergency department which again argues against infection.  Patient was instructed to follow-up with her neurosurgeon and I gave her the information for them to set up an appointment.  Return precautions discussed prior to discharge.  This patient presents to the ED for concern of complaints listed in HPI, this involves an extensive number of treatment options, and is a complaint that carries with it a high risk of complications and morbidity. Disposition including potential need for admission considered.   Dispo: DC Home. Return precautions discussed including, but not limited to, those listed in the AVS.  Allowed pt time to ask questions which were answered fully prior to dc.  Records reviewed Outpatient Clinic Notes The following labs were independently interpreted: CBC and show no acute abnormality I independently reviewed the following imaging with scope of interpretation limited to determining acute life threatening conditions related to emergency care: Chest x-ray and agree with the radiologist interpretation with the following exceptions: none I personally reviewed and interpreted cardiac monitoring: normal sinus rhythm  I personally reviewed and interpreted the pt's EKG: see above for interpretation  I have reviewed the patients home medications and made adjustments as needed  Final Clinical Impression(s) / ED Diagnoses Final diagnoses:  S/P VP shunt    Rx / DC Orders ED Discharge Orders     None         Rondel Baton, MD 10/07/22 1622

## 2022-10-07 NOTE — Discharge Instructions (Signed)
You were seen for your shunt pain in the emergency department.   At home, please take Tylenol and ibuprofen for symptoms.    Follow-up with your neurosurgeon (Dr. Dutch Quint) in 2-3 days regarding your visit.    Return immediately to the emergency department if you experience any of the following: Severe headaches, neck pain, or any other concerning symptoms.    Thank you for visiting our Emergency Department. It was a pleasure taking care of you today.

## 2022-10-07 NOTE — ED Triage Notes (Signed)
Report ventriculoperitoneal shunt pain  pointing to her epigastric area . Last surgery was 2005. Denies NV . Reports possible infection . Denies shortness of breath . Reports tubing pain in chest .

## 2022-11-18 DIAGNOSIS — R2681 Unsteadiness on feet: Secondary | ICD-10-CM | POA: Insufficient documentation

## 2022-11-21 DIAGNOSIS — Z982 Presence of cerebrospinal fluid drainage device: Secondary | ICD-10-CM | POA: Insufficient documentation

## 2023-03-04 ENCOUNTER — Encounter (HOSPITAL_BASED_OUTPATIENT_CLINIC_OR_DEPARTMENT_OTHER): Payer: Self-pay | Admitting: Pediatrics

## 2023-03-04 ENCOUNTER — Emergency Department (HOSPITAL_BASED_OUTPATIENT_CLINIC_OR_DEPARTMENT_OTHER): Payer: 59

## 2023-03-04 ENCOUNTER — Emergency Department (HOSPITAL_BASED_OUTPATIENT_CLINIC_OR_DEPARTMENT_OTHER)
Admission: EM | Admit: 2023-03-04 | Discharge: 2023-03-04 | Disposition: A | Discharge status: Home / Self Care | Payer: 59 | Attending: Emergency Medicine | Admitting: Emergency Medicine

## 2023-03-04 ENCOUNTER — Other Ambulatory Visit: Payer: Self-pay

## 2023-03-04 DIAGNOSIS — R0789 Other chest pain: Secondary | ICD-10-CM | POA: Insufficient documentation

## 2023-03-04 DIAGNOSIS — Z91040 Latex allergy status: Secondary | ICD-10-CM | POA: Insufficient documentation

## 2023-03-04 LAB — CBC
HCT: 39.9 % (ref 36.0–46.0)
Hemoglobin: 12.9 g/dL (ref 12.0–15.0)
MCH: 27.8 pg (ref 26.0–34.0)
MCHC: 32.3 g/dL (ref 30.0–36.0)
MCV: 86 fL (ref 80.0–100.0)
Platelets: 228 10*3/uL (ref 150–400)
RBC: 4.64 MIL/uL (ref 3.87–5.11)
RDW: 12.6 % (ref 11.5–15.5)
WBC: 6.2 10*3/uL (ref 4.0–10.5)
nRBC: 0 % (ref 0.0–0.2)

## 2023-03-04 LAB — BASIC METABOLIC PANEL
Anion gap: 8 (ref 5–15)
BUN: 14 mg/dL (ref 6–20)
CO2: 27 mmol/L (ref 22–32)
Calcium: 8.6 mg/dL — ABNORMAL LOW (ref 8.9–10.3)
Chloride: 106 mmol/L (ref 98–111)
Creatinine, Ser: 0.65 mg/dL (ref 0.44–1.00)
GFR, Estimated: >60 mL/min (ref >60)
Glucose, Bld: 124 mg/dL — ABNORMAL HIGH (ref 70–99)
Potassium: 3.8 mmol/L (ref 3.5–5.1)
Sodium: 141 mmol/L (ref 135–145)

## 2023-03-04 LAB — TROPONIN I (HIGH SENSITIVITY): Troponin I (High Sensitivity): <2 ng/L (ref <18)

## 2023-03-04 NOTE — ED Provider Notes (Signed)
[place] [facility] Provider Note   CSN: [id number] Arrival date & time: 03/04/23  1306     History  Chief Complaint  Patient presents with   Chest Pain    Beth Kennedy is a 52 y.o. female.  52 year old female presents today for intermittent chest pain that is substernal for the past 3 days.  Pain comes and goes.  No alleviating or aggravating factors identified.  No personal cardiac history.  Does endorse that CHF runs in her family.  No significant chest pain right now.  No other associated nausea, vomiting, diaphoresis.  The history is provided by the patient. No language interpreter was used.       Home Medications Prior to Admission medications   Medication Sig Start Date End Date Taking? Authorizing Provider  baclofen (LIORESAL) 10 MG tablet 1/2 tablet in the morning and one at night 11/20/20   Lomax, Amy, NP  BISACODYL 5 MG EC tablet TAKE 4 TABLETS BY MOUTH 2 TIMES A WEEK 07/06/20   [provider]  DULoxetine (CYMBALTA) 30 MG capsule One tablet daily for 2 weeks, then take one twice a day 11/20/20   Lomax, Amy, NP  LINZESS 290 MCG CAPS capsule Take 290 mcg by mouth daily. 07/12/20   [provider]  lubiprostone (AMITIZA) 8 MCG capsule Take 1 capsule (8 mcg total) by mouth 2 (two) times daily as needed for constipation. 07/14/20   York Spaniel, MD  pantoprazole (PROTONIX) 40 MG tablet Take 40 mg by mouth daily as needed (acid reflux).     [provider]  promethazine (PHENERGAN) 12.5 MG tablet TAKE 1 TABLET BY MOUTH FIVE TIMES A DAY AS NEEDED FOR 30 DAYS 06/15/20   [provider]      Allergies    Buprenorphine hcl, Morphine and codeine, Kiwi extract, Other, Promethazine, Latex, Percocet [oxycodone-acetaminophen], and Tape    Review of Systems   Review of Systems  Constitutional:  Negative for fever.  Respiratory:  Negative for shortness of breath.   Cardiovascular:  Positive for chest pain.  Negative for palpitations and leg swelling.  Gastrointestinal:  Negative for abdominal pain and nausea.  Genitourinary:  Negative for flank pain.  Neurological:  Negative for light-headedness.  All other systems reviewed and are negative.   Physical Exam Updated Vital Signs BP 104/73 (BP Location: Left Arm)   Pulse 69   Temp 98.6 F (37 C) (Oral)   Resp 16   Ht 5\' 8"  (1.727 m)   Wt 88.9 kg   SpO2 100%   BMI 29.80 kg/m  Physical Exam Vitals and nursing note reviewed.  Constitutional:      General: She is not in acute distress.    Appearance: Normal appearance. She is not ill-appearing.  HENT:     Head: Normocephalic and atraumatic.     Nose: Nose normal.  Eyes:     Conjunctiva/sclera: Conjunctivae normal.  Cardiovascular:     Rate and Rhythm: Normal rate and regular rhythm.     Heart sounds: Normal heart sounds.  Pulmonary:     Effort: Pulmonary effort is normal. No respiratory distress.     Breath sounds: Normal breath sounds. No wheezing.  Abdominal:     Palpations: Abdomen is soft.  Musculoskeletal:        General: No deformity. Normal range of motion.     Cervical back: Normal range of motion.  Skin:    Findings: No rash.  Neurological:  Mental Status: She is alert.     ED Results / Procedures / Treatments   Labs (all labs ordered are listed, but only abnormal results are displayed) Labs Reviewed  BASIC METABOLIC PANEL - Abnormal; Notable for the following components:      Result Value   Glucose, Bld 124 (*)    Calcium 8.6 (*)    All other components within normal limits  CBC  TROPONIN I (HIGH SENSITIVITY)    EKG EKG Interpretation Date/Time:  Tuesday March 04 2023 13:21:39 EDT Ventricular Rate:  66 PR Interval:  137 QRS Duration:  71 QT Interval:  378 QTC Calculation: 396 R Axis:   60  Text Interpretation: Sinus rhythm No significant change since last tracing Confirmed by Melene Plan [phone]) on 03/04/2023 2:31:00 PM  Radiology DG Chest  Portable 1 View  Result Date: 03/04/2023 CLINICAL DATA:  Chest pain. EXAM: PORTABLE CHEST 1 VIEW COMPARISON:  10/07/2022. FINDINGS: Bilateral lung fields are clear. Apparent blunting of left lateral costophrenic angle is likely secondary to overlying soft tissues. Right lateral costophrenic angle is clear. Normal cardio-mediastinal silhouette. No acute osseous abnormalities. The soft tissues are within normal limits. Partially calcified presumed VP shunting catheters noted along the right side of the spine. IMPRESSION: No active disease. Electronically Signed   By: Jules Schick M.D.   On: 03/04/2023 14:00    Procedures Procedures    Medications Ordered in ED Medications - No data to display  ED Course/ Medical Decision Making/ A&P                                 Medical Decision Making Amount and/or Complexity of Data Reviewed Labs: ordered. Radiology: ordered.   52 year old female presents today for concern of chest pain.  No previous cardiac history.  Low heart score.  Denies recent long travel, recent surgery, previous history of DVT or PE.  Denies pleuritic chest pain.  Will obtain ACS workup.  CBC is unremarkable.  BMP with glucose of 124 otherwise without acute concern.  Initial troponin undetectable.  EKG without acute ischemic changes.  Chest x-ray without acute cardiopulmonary process.  Given duration of symptoms do not feel repeat troponin is warranted.  Low suspicion for ACS.  Cardiology referral given given family history and chest pain.  Low suspicion for PE on Wells criteria.  PERC negative.  Patient is agreeable.  Return precaution discussed.  Patient voices understanding and is in agreement with plan.  Final Clinical Impression(s) / ED Diagnoses Final diagnoses:  Atypical chest pain    Rx / DC Orders ED Discharge Orders          Ordered    Ambulatory referral to Cardiology       Comments: If you have not heard from the Cardiology office within the next 72 hours  please call [phone].   03/04/23 1455              Marita Kansas, PA-C 03/04/23 1502    Melene Plan, DO 03/06/23 [phone]

## 2023-03-04 NOTE — Discharge Instructions (Signed)
Your workup today is reassuring.  No concerning cause of your chest pain.  I have given you referral to cardiologist.  If you have any concerning symptoms return to the emergency room.

## 2023-03-04 NOTE — ED Triage Notes (Signed)
C/O chest pain, midsternal and to the left started 3 days ago, initially was intermittent but becoming more constant. Reports HF runs in her family.

## 2023-04-07 DIAGNOSIS — F319 Bipolar disorder, unspecified: Secondary | ICD-10-CM | POA: Insufficient documentation

## 2023-04-07 DIAGNOSIS — M5030 Other cervical disc degeneration, unspecified cervical region: Secondary | ICD-10-CM | POA: Insufficient documentation

## 2023-04-07 DIAGNOSIS — F32A Depression, unspecified: Secondary | ICD-10-CM | POA: Insufficient documentation

## 2023-04-07 DIAGNOSIS — Z8669 Personal history of other diseases of the nervous system and sense organs: Secondary | ICD-10-CM | POA: Insufficient documentation

## 2023-04-07 DIAGNOSIS — K5792 Diverticulitis of intestine, part unspecified, without perforation or abscess without bleeding: Secondary | ICD-10-CM | POA: Insufficient documentation

## 2023-04-07 DIAGNOSIS — G935 Compression of brain: Secondary | ICD-10-CM | POA: Insufficient documentation

## 2023-04-07 DIAGNOSIS — M51369 Other intervertebral disc degeneration, lumbar region without mention of lumbar back pain or lower extremity pain: Secondary | ICD-10-CM | POA: Insufficient documentation

## 2023-04-07 DIAGNOSIS — G039 Meningitis, unspecified: Secondary | ICD-10-CM | POA: Insufficient documentation

## 2023-04-07 DIAGNOSIS — K219 Gastro-esophageal reflux disease without esophagitis: Secondary | ICD-10-CM | POA: Insufficient documentation

## 2023-04-07 DIAGNOSIS — I2089 Other forms of angina pectoris: Secondary | ICD-10-CM | POA: Insufficient documentation

## 2023-04-07 DIAGNOSIS — G8929 Other chronic pain: Secondary | ICD-10-CM | POA: Insufficient documentation

## 2023-04-07 DIAGNOSIS — O009 Unspecified ectopic pregnancy without intrauterine pregnancy: Secondary | ICD-10-CM | POA: Insufficient documentation

## 2023-04-08 ENCOUNTER — Ambulatory Visit: Payer: 59

## 2023-05-19 ENCOUNTER — Other Ambulatory Visit: Payer: Self-pay

## 2023-05-20 ENCOUNTER — Ambulatory Visit: Payer: 59 | Attending: Cardiology | Admitting: Cardiology
